# Patient Record
Sex: Female | Born: 1980 | Race: Black or African American | Hispanic: No | Marital: Married | Smoking: Former smoker
Health system: Southern US, Community
[De-identification: ages and names within clinical notes are randomized; demographics above are authoritative.]

## PROBLEM LIST (undated history)

## (undated) DIAGNOSIS — O24419 Gestational diabetes mellitus in pregnancy, unspecified control: Secondary | ICD-10-CM

## (undated) DIAGNOSIS — B009 Herpesviral infection, unspecified: Secondary | ICD-10-CM

## (undated) DIAGNOSIS — Z8619 Personal history of other infectious and parasitic diseases: Secondary | ICD-10-CM

## (undated) DIAGNOSIS — K219 Gastro-esophageal reflux disease without esophagitis: Secondary | ICD-10-CM

## (undated) HISTORY — DX: Gestational diabetes mellitus in pregnancy, unspecified control: O24.419

## (undated) HISTORY — DX: Gastro-esophageal reflux disease without esophagitis: K21.9

## (undated) HISTORY — DX: Herpesviral infection, unspecified: B00.9

## (undated) HISTORY — DX: Personal history of other infectious and parasitic diseases: Z86.19

## (undated) HISTORY — PX: NO PAST SURGERIES: SHX2092

---
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
## 2012-11-03 ENCOUNTER — Other Ambulatory Visit (HOSPITAL_COMMUNITY)
Admission: RE | Admit: 2012-11-03 | Discharge: 2012-11-03 | Disposition: A | Discharge status: Home / Self Care | Payer: BC Managed Care – PPO | Source: Ambulatory Visit | Attending: Family Medicine | Admitting: Family Medicine

## 2012-11-03 ENCOUNTER — Other Ambulatory Visit: Payer: Self-pay | Admitting: Family Medicine

## 2012-11-03 DIAGNOSIS — Z113 Encounter for screening for infections with a predominantly sexual mode of transmission: Secondary | ICD-10-CM | POA: Insufficient documentation

## 2012-11-03 DIAGNOSIS — Z01419 Encounter for gynecological examination (general) (routine) without abnormal findings: Secondary | ICD-10-CM | POA: Insufficient documentation

## 2014-06-30 LAB — OB RESULTS CONSOLE ABO/RH: RH Type: POSITIVE

## 2014-06-30 LAB — OB RESULTS CONSOLE GC/CHLAMYDIA
Chlamydia: NEGATIVE
GC PROBE AMP, GENITAL: NEGATIVE

## 2014-06-30 LAB — OB RESULTS CONSOLE HEPATITIS B SURFACE ANTIGEN: Hepatitis B Surface Ag: NEGATIVE

## 2014-06-30 LAB — OB RESULTS CONSOLE RPR: RPR: NONREACTIVE

## 2014-06-30 LAB — OB RESULTS CONSOLE HIV ANTIBODY (ROUTINE TESTING): HIV: NONREACTIVE

## 2014-06-30 LAB — OB RESULTS CONSOLE RUBELLA ANTIBODY, IGM: Rubella: IMMUNE

## 2014-06-30 LAB — OB RESULTS CONSOLE ANTIBODY SCREEN: Antibody Screen: NEGATIVE

## 2014-08-26 NOTE — L&D Delivery Note (Signed)
0600: Nurse call reporting patient c/o constant pressure and urge to push. Reports VE C/C with BBOW. In room to assess and BOW noted at introitus.  Patient requesting AROM, which was performed with moderate amount light MSF noted.  After AROM, infant hair noted at introitus and patient instructed on pushing methods.  FHR remained reassuring throughout pushing efforts and patient delivered as below.    Delivery Note At 6:13 AM, on Dec 26, 2014, a viable female, Anna Schwartz, was delivered via Vaginal, Spontaneous Delivery (Presentation: Left Occiput Anterior with restitution to Direct OP) Shoulders delivered easily and infant with good tone and spontaneous cry. Tactile stimulation given by provider and infant placed on mother's abdomen where nurse continued tactile stimulation. Infant APGARs: 8, 9.  Cord clamped, cut, and blood collected. Placenta delivered spontaneously and noted to be intact with 3VC upon inspection. Vaginal inspection revealed bilateral periurethral lacerations, with the left side extending into the labia, and a small vaginal laceration that was repaired for hemostatis.  No additional anesthetic necessary and patient tolerated the procedure well.  Fundus firm, at the umbilicus, and bleeding small.  Mother hemodynamically stable and infant nursing prior to provider exit.  Mother birth control method not assessed.  Infant weight at one hour of life: 7lbs 12.3oz (3525g).    Anesthesia: Epidural  Episiotomy: None Lacerations: 1st Degree, Periurethral Suture Repair: 3.0 vicryl Est. Blood Loss (mL): 201  Mom to postpartum.  Baby to Couplet care / Skin to Skin.  Detrick Dani LYNN MSN, CNM 12/26/2014, 7:17 AM

## 2014-09-13 ENCOUNTER — Inpatient Hospital Stay (HOSPITAL_COMMUNITY): Admission: AD | Admit: 2014-09-13 | Payer: Self-pay | Source: Ambulatory Visit | Admitting: Obstetrics & Gynecology

## 2014-11-16 ENCOUNTER — Encounter: Payer: BLUE CROSS/BLUE SHIELD | Attending: Obstetrics & Gynecology

## 2014-11-16 VITALS — Ht 63.0 in | Wt 205.1 lb

## 2014-11-16 DIAGNOSIS — Z713 Dietary counseling and surveillance: Secondary | ICD-10-CM | POA: Insufficient documentation

## 2014-11-16 DIAGNOSIS — O24419 Gestational diabetes mellitus in pregnancy, unspecified control: Secondary | ICD-10-CM | POA: Diagnosis not present

## 2014-11-16 NOTE — Progress Notes (Signed)
  Patient was seen on 11/09/14 for Gestational Diabetes self-management . The following learning objectives were met by the patient :   States the definition of Gestational Diabetes  States why dietary management is important in controlling blood glucose  Describes the effects of carbohydrates on blood glucose levels  Demonstrates ability to create a balanced meal plan  Demonstrates carbohydrate counting   States when to check blood glucose levels  Demonstrates proper blood glucose monitoring techniques  States the effect of stress and exercise on blood glucose levels  States the importance of limiting caffeine and abstaining from alcohol and smoking  Plan:  Aim for 2 Carb Choices per meal (30 grams) +/- 1 either way for breakfast Aim for 3 Carb Choices per meal (45 grams) +/- 1 either way from lunch and dinner Aim for 1-2 Carbs per snack Begin reading food labels for Total Carbohydrate and sugar grams of foods Consider  increasing your activity level by walking daily as tolerated Begin checking BG before breakfast and 2 hours after first bit of breakfast, lunch and dinner after  as directed by MD  Take medication  as directed by MD  Blood glucose monitor given: Provided by MD  Patient instructed to monitor glucose levels: FBS: 60 - <90 2 hour: <120  Patient received the following handouts:  Nutrition Diabetes and Pregnancy  Carbohydrate Counting List  Meal Planning worksheet  Patient will be seen for follow-up as needed.

## 2014-12-02 ENCOUNTER — Inpatient Hospital Stay (HOSPITAL_COMMUNITY)
Admission: AD | Admit: 2014-12-02 | Discharge: 2014-12-02 | Disposition: A | Discharge status: Home / Self Care | Payer: BLUE CROSS/BLUE SHIELD | Source: Ambulatory Visit | Attending: Obstetrics & Gynecology | Admitting: Obstetrics & Gynecology

## 2014-12-02 ENCOUNTER — Encounter (HOSPITAL_COMMUNITY): Payer: Self-pay | Admitting: *Deleted

## 2014-12-02 DIAGNOSIS — Z3A37 37 weeks gestation of pregnancy: Secondary | ICD-10-CM | POA: Diagnosis not present

## 2014-12-02 DIAGNOSIS — Z3689 Encounter for other specified antenatal screening: Secondary | ICD-10-CM

## 2014-12-02 HISTORY — DX: Gestational diabetes mellitus in pregnancy, unspecified control: O24.419

## 2014-12-02 LAB — CBC WITH DIFFERENTIAL/PLATELET
Basophils Absolute: 0 10*3/uL (ref 0.0–0.1)
Basophils Relative: 0 % (ref 0–1)
EOS PCT: 2 % (ref 0–5)
Eosinophils Absolute: 0.1 10*3/uL (ref 0.0–0.7)
HEMATOCRIT: 34.8 % — AB (ref 36.0–46.0)
Hemoglobin: 12.1 g/dL (ref 12.0–15.0)
LYMPHS ABS: 1.4 10*3/uL (ref 0.7–4.0)
LYMPHS PCT: 24 % (ref 12–46)
MCH: 27.9 pg (ref 26.0–34.0)
MCHC: 34.8 g/dL (ref 30.0–36.0)
MCV: 80.4 fL (ref 78.0–100.0)
MONO ABS: 0.5 10*3/uL (ref 0.1–1.0)
Monocytes Relative: 9 % (ref 3–12)
Neutro Abs: 3.8 10*3/uL (ref 1.7–7.7)
Neutrophils Relative %: 65 % (ref 43–77)
Platelets: 221 10*3/uL (ref 150–400)
RBC: 4.33 MIL/uL (ref 3.87–5.11)
RDW: 15.1 % (ref 11.5–15.5)
WBC: 5.9 10*3/uL (ref 4.0–10.5)

## 2014-12-02 NOTE — Discharge Instructions (Signed)
Fetal Movement Counts °Patient Name: __________________________________________________ Patient Due Date: ____________________ °Performing a fetal movement count is highly recommended in high-risk pregnancies, but it is good for every pregnant woman to do. Your health care provider may ask you to start counting fetal movements at 28 weeks of the pregnancy. Fetal movements often increase: °· After eating a full meal. °· After physical activity. °· After eating or drinking something sweet or cold. °· At rest. °Pay attention to when you feel the baby is most active. This will help you notice a pattern of your baby's sleep and wake cycles and what factors contribute to an increase in fetal movement. It is important to perform a fetal movement count at the same time each day when your baby is normally most active.  °HOW TO COUNT FETAL MOVEMENTS °1. Find a quiet and comfortable area to sit or lie down on your left side. Lying on your left side provides the best blood and oxygen circulation to your baby. °2. Write down the day and time on a sheet of paper or in a journal. °3. Start counting kicks, flutters, swishes, rolls, or jabs in a 2-hour period. You should feel at least 10 movements within 2 hours. °4. If you do not feel 10 movements in 2 hours, wait 2-3 hours and count again. Look for a change in the pattern or not enough counts in 2 hours. °SEEK MEDICAL CARE IF: °· You feel less than 10 counts in 2 hours, tried twice. °· There is no movement in over an hour. °· The pattern is changing or taking longer each day to reach 10 counts in 2 hours. °· You feel the baby is not moving as he or she usually does. °Date: ____________ Movements: ____________ Start time: ____________ Finish time: ____________  °Date: ____________ Movements: ____________ Start time: ____________ Finish time: ____________ °Date: ____________ Movements: ____________ Start time: ____________ Finish time: ____________ °Date: ____________ Movements:  ____________ Start time: ____________ Finish time: ____________ °Date: ____________ Movements: ____________ Start time: ____________ Finish time: ____________ °Date: ____________ Movements: ____________ Start time: ____________ Finish time: ____________ °Date: ____________ Movements: ____________ Start time: ____________ Finish time: ____________ °Date: ____________ Movements: ____________ Start time: ____________ Finish time: ____________  °Date: ____________ Movements: ____________ Start time: ____________ Finish time: ____________ °Date: ____________ Movements: ____________ Start time: ____________ Finish time: ____________ °Date: ____________ Movements: ____________ Start time: ____________ Finish time: ____________ °Date: ____________ Movements: ____________ Start time: ____________ Finish time: ____________ °Date: ____________ Movements: ____________ Start time: ____________ Finish time: ____________ °Date: ____________ Movements: ____________ Start time: ____________ Finish time: ____________ °Date: ____________ Movements: ____________ Start time: ____________ Finish time: ____________  °Date: ____________ Movements: ____________ Start time: ____________ Finish time: ____________ °Date: ____________ Movements: ____________ Start time: ____________ Finish time: ____________ °Date: ____________ Movements: ____________ Start time: ____________ Finish time: ____________ °Date: ____________ Movements: ____________ Start time: ____________ Finish time: ____________ °Date: ____________ Movements: ____________ Start time: ____________ Finish time: ____________ °Date: ____________ Movements: ____________ Start time: ____________ Finish time: ____________ °Date: ____________ Movements: ____________ Start time: ____________ Finish time: ____________  °Date: ____________ Movements: ____________ Start time: ____________ Finish time: ____________ °Date: ____________ Movements: ____________ Start time: ____________ Finish  time: ____________ °Date: ____________ Movements: ____________ Start time: ____________ Finish time: ____________ °Date: ____________ Movements: ____________ Start time: ____________ Finish time: ____________ °Date: ____________ Movements: ____________ Start time: ____________ Finish time: ____________ °Date: ____________ Movements: ____________ Start time: ____________ Finish time: ____________ °Date: ____________ Movements: ____________ Start time: ____________ Finish time: ____________  °Date: ____________ Movements: ____________ Start time: ____________ Finish   time: ____________ °Date: ____________ Movements: ____________ Start time: ____________ Finish time: ____________ °Date: ____________ Movements: ____________ Start time: ____________ Finish time: ____________ °Date: ____________ Movements: ____________ Start time: ____________ Finish time: ____________ °Date: ____________ Movements: ____________ Start time: ____________ Finish time: ____________ °Date: ____________ Movements: ____________ Start time: ____________ Finish time: ____________ °Date: ____________ Movements: ____________ Start time: ____________ Finish time: ____________  °Date: ____________ Movements: ____________ Start time: ____________ Finish time: ____________ °Date: ____________ Movements: ____________ Start time: ____________ Finish time: ____________ °Date: ____________ Movements: ____________ Start time: ____________ Finish time: ____________ °Date: ____________ Movements: ____________ Start time: ____________ Finish time: ____________ °Date: ____________ Movements: ____________ Start time: ____________ Finish time: ____________ °Date: ____________ Movements: ____________ Start time: ____________ Finish time: ____________ °Date: ____________ Movements: ____________ Start time: ____________ Finish time: ____________  °Date: ____________ Movements: ____________ Start time: ____________ Finish time: ____________ °Date: ____________  Movements: ____________ Start time: ____________ Finish time: ____________ °Date: ____________ Movements: ____________ Start time: ____________ Finish time: ____________ °Date: ____________ Movements: ____________ Start time: ____________ Finish time: ____________ °Date: ____________ Movements: ____________ Start time: ____________ Finish time: ____________ °Date: ____________ Movements: ____________ Start time: ____________ Finish time: ____________ °Date: ____________ Movements: ____________ Start time: ____________ Finish time: ____________  °Date: ____________ Movements: ____________ Start time: ____________ Finish time: ____________ °Date: ____________ Movements: ____________ Start time: ____________ Finish time: ____________ °Date: ____________ Movements: ____________ Start time: ____________ Finish time: ____________ °Date: ____________ Movements: ____________ Start time: ____________ Finish time: ____________ °Date: ____________ Movements: ____________ Start time: ____________ Finish time: ____________ °Date: ____________ Movements: ____________ Start time: ____________ Finish time: ____________ °Document Released: 09/11/2006 Document Revised: 12/27/2013 Document Reviewed: 06/08/2012 °Exit[employer]® Patient Information ©2015 [employer]. This information is not intended to replace advice given to you by your health care provider. Make sure you discuss any questions you have with your health care provider. °Braxton Hicks Contractions °Contractions of the uterus can occur throughout pregnancy. Contractions are not always a sign that you are in labor.  °WHAT ARE BRAXTON HICKS CONTRACTIONS?  °Contractions that occur before labor are called Braxton Hicks contractions, or false labor. Toward the end of pregnancy (32-34 weeks), these contractions can develop more often and may become more forceful. This is not true labor because these contractions do not result in opening (dilatation) and thinning of the cervix. They  are sometimes difficult to tell apart from true labor because these contractions can be forceful and people have different pain tolerances. You should not feel embarrassed if you go to the hospital with false labor. Sometimes, the only way to tell if you are in true labor is for your health care provider to look for changes in the cervix. °If there are no prenatal problems or other health problems associated with the pregnancy, it is completely safe to be sent home with false labor and await the onset of true labor. °HOW CAN YOU TELL THE DIFFERENCE BETWEEN TRUE AND FALSE LABOR? °False Labor °· The contractions of false labor are usually shorter and not as hard as those of true labor.   °· The contractions are usually irregular.   °· The contractions are often felt in the front of the lower abdomen and in the groin.   °· The contractions may go away when you walk around or change positions while lying down.   °· The contractions get weaker and are shorter lasting as time goes on.   °· The contractions do not usually become progressively stronger, regular, and closer together as with true labor.   °True Labor °5. Contractions in true labor last 30-70 seconds, become   very regular, usually become more intense, and increase in frequency.   °6. The contractions do not go away with walking.   °7. The discomfort is usually felt in the top of the uterus and spreads to the lower abdomen and low back.   °8. True labor can be determined by your health care provider with an exam. This will show that the cervix is dilating and getting thinner.   °WHAT TO REMEMBER °· Keep up with your usual exercises and follow other instructions given by your health care provider.   °· Take medicines as directed by your health care provider.   °· Keep your regular prenatal appointments.   °· Eat and drink lightly if you think you are going into labor.   °· If Braxton Hicks contractions are making you uncomfortable:   °· Change your position from  lying down or resting to walking, or from walking to resting.   °· Sit and rest in a tub of warm water.   °· Drink 2-3 glasses of water. Dehydration may cause these contractions.   °· Do slow and deep breathing several times an hour.   °WHEN SHOULD I SEEK IMMEDIATE MEDICAL CARE? °Seek immediate medical care if: °· Your contractions become stronger, more regular, and closer together.   °· You have fluid leaking or gushing from your vagina.   °· You have a fever.   °· You pass blood-tinged mucus.   °· You have vaginal bleeding.   °· You have continuous abdominal pain.   °· You have low back pain that you never had before.   °· You feel your baby's head pushing down and causing pelvic pressure.   °· Your baby is not moving as much as it used to.   °Document Released: 08/12/2005 Document Revised: 08/17/2013 Document Reviewed: 05/24/2013 °ExitCare® Patient Information ©2015 [employer]. This information is not intended to replace advice given to you by your health care provider. Make sure you discuss any questions you have with your health care provider. ° °
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
## 2014-12-14 ENCOUNTER — Telehealth (HOSPITAL_COMMUNITY): Payer: Self-pay | Admitting: *Deleted

## 2014-12-14 ENCOUNTER — Encounter (HOSPITAL_COMMUNITY): Payer: Self-pay | Admitting: *Deleted

## 2014-12-14 LAB — OB RESULTS CONSOLE GBS: GBS: POSITIVE

## 2014-12-14 NOTE — Telephone Encounter (Signed)
Preadmission screen  

## 2014-12-25 ENCOUNTER — Encounter (HOSPITAL_COMMUNITY): Payer: Self-pay | Admitting: *Deleted

## 2014-12-25 ENCOUNTER — Inpatient Hospital Stay (HOSPITAL_COMMUNITY)
Admission: AD | Admit: 2014-12-25 | Discharge: 2014-12-28 | DRG: 774 | Disposition: A | Discharge status: Home / Self Care | Payer: BLUE CROSS/BLUE SHIELD | Source: Ambulatory Visit | Attending: Obstetrics & Gynecology | Admitting: Obstetrics & Gynecology

## 2014-12-25 ENCOUNTER — Telehealth: Payer: Self-pay | Admitting: Obstetrics and Gynecology

## 2014-12-25 ENCOUNTER — Telehealth: Payer: Self-pay

## 2014-12-25 ENCOUNTER — Inpatient Hospital Stay (HOSPITAL_COMMUNITY): Payer: BLUE CROSS/BLUE SHIELD | Admitting: Anesthesiology

## 2014-12-25 DIAGNOSIS — Z87891 Personal history of nicotine dependence: Secondary | ICD-10-CM

## 2014-12-25 DIAGNOSIS — O99824 Streptococcus B carrier state complicating childbirth: Secondary | ICD-10-CM | POA: Diagnosis present

## 2014-12-25 DIAGNOSIS — O9962 Diseases of the digestive system complicating childbirth: Secondary | ICD-10-CM | POA: Diagnosis present

## 2014-12-25 DIAGNOSIS — B951 Streptococcus, group B, as the cause of diseases classified elsewhere: Secondary | ICD-10-CM

## 2014-12-25 DIAGNOSIS — O9832 Other infections with a predominantly sexual mode of transmission complicating childbirth: Secondary | ICD-10-CM | POA: Diagnosis present

## 2014-12-25 DIAGNOSIS — A6000 Herpesviral infection of urogenital system, unspecified: Secondary | ICD-10-CM | POA: Diagnosis present

## 2014-12-25 DIAGNOSIS — Z8249 Family history of ischemic heart disease and other diseases of the circulatory system: Secondary | ICD-10-CM | POA: Diagnosis not present

## 2014-12-25 DIAGNOSIS — Z833 Family history of diabetes mellitus: Secondary | ICD-10-CM | POA: Diagnosis not present

## 2014-12-25 DIAGNOSIS — K219 Gastro-esophageal reflux disease without esophagitis: Secondary | ICD-10-CM | POA: Diagnosis present

## 2014-12-25 DIAGNOSIS — O24420 Gestational diabetes mellitus in childbirth, diet controlled: Secondary | ICD-10-CM | POA: Diagnosis present

## 2014-12-25 DIAGNOSIS — Z823 Family history of stroke: Secondary | ICD-10-CM | POA: Diagnosis not present

## 2014-12-25 DIAGNOSIS — Z3A40 40 weeks gestation of pregnancy: Secondary | ICD-10-CM | POA: Diagnosis present

## 2014-12-25 DIAGNOSIS — O24419 Gestational diabetes mellitus in pregnancy, unspecified control: Secondary | ICD-10-CM | POA: Diagnosis present

## 2014-12-25 DIAGNOSIS — O9989 Other specified diseases and conditions complicating pregnancy, childbirth and the puerperium: Secondary | ICD-10-CM | POA: Diagnosis present

## 2014-12-25 DIAGNOSIS — B009 Herpesviral infection, unspecified: Secondary | ICD-10-CM | POA: Diagnosis not present

## 2014-12-25 HISTORY — DX: Personal history of other infectious and parasitic diseases: Z86.19

## 2014-12-25 LAB — COMPREHENSIVE METABOLIC PANEL
ALBUMIN: 2.9 g/dL — AB (ref 3.5–5.0)
ALT: 27 U/L (ref 14–54)
ANION GAP: 8 (ref 5–15)
AST: 36 U/L (ref 15–41)
Alkaline Phosphatase: 103 U/L (ref 38–126)
BUN: 6 mg/dL (ref 6–20)
CALCIUM: 8.6 mg/dL — AB (ref 8.9–10.3)
CO2: 22 mmol/L (ref 22–32)
Chloride: 105 mmol/L (ref 101–111)
Creatinine, Ser: 0.72 mg/dL (ref 0.44–1.00)
GFR calc Af Amer: >60 mL/min (ref >60)
Glucose, Bld: 67 mg/dL — ABNORMAL LOW (ref 70–99)
Potassium: 4.1 mmol/L (ref 3.5–5.1)
SODIUM: 135 mmol/L (ref 135–145)
Total Bilirubin: 0.3 mg/dL (ref 0.3–1.2)
Total Protein: 6 g/dL — ABNORMAL LOW (ref 6.5–8.1)

## 2014-12-25 LAB — GLUCOSE, CAPILLARY
GLUCOSE-CAPILLARY: 90 mg/dL (ref 70–99)
Glucose-Capillary: 79 mg/dL (ref 70–99)
Glucose-Capillary: 70 mg/dL (ref 70–99)

## 2014-12-25 LAB — CBC
HCT: 35.3 % — ABNORMAL LOW (ref 36.0–46.0)
Hemoglobin: 12.4 g/dL (ref 12.0–15.0)
MCH: 28.5 pg (ref 26.0–34.0)
MCHC: 35.1 g/dL (ref 30.0–36.0)
MCV: 81.1 fL (ref 78.0–100.0)
Platelets: 213 10*3/uL (ref 150–400)
RBC: 4.35 MIL/uL (ref 3.87–5.11)
RDW: 15.6 % — AB (ref 11.5–15.5)
WBC: 5.7 10*3/uL (ref 4.0–10.5)

## 2014-12-25 LAB — LACTATE DEHYDROGENASE: LDH: 252 U/L — ABNORMAL HIGH (ref 98–192)

## 2014-12-25 LAB — URIC ACID: Uric Acid, Serum: 3.8 mg/dL (ref 2.3–6.6)

## 2014-12-25 MED ORDER — LIDOCAINE HCL (PF) 1 % IJ SOLN
30.0000 mL | INTRAMUSCULAR | Status: DC | PRN
  Filled 2014-12-25: qty 30

## 2014-12-25 MED ORDER — OXYTOCIN 40 UNITS IN LACTATED RINGERS INFUSION - SIMPLE MED: 62.5000 mL/h | INTRAVENOUS | Status: DC

## 2014-12-25 MED ORDER — OXYTOCIN 40 UNITS IN LACTATED RINGERS INFUSION - SIMPLE MED
1.0000 m[IU]/min | INTRAVENOUS | Status: DC
  Administered 2014-12-25: 4 m[IU]/min via INTRAVENOUS

## 2014-12-25 MED ORDER — LACTATED RINGERS IV SOLN
500.0000 mL | INTRAVENOUS | Status: DC | PRN
  Administered 2014-12-25: 1000 mL via INTRAVENOUS

## 2014-12-25 MED ORDER — PENICILLIN G POTASSIUM 5000000 UNITS IJ SOLR
5.0000 10*6.[IU] | Freq: Once | INTRAVENOUS | Status: AC
  Administered 2014-12-25: 5 10*6.[IU] via INTRAVENOUS
  Filled 2014-12-25: qty 5

## 2014-12-25 MED ORDER — DIPHENHYDRAMINE HCL 50 MG/ML IJ SOLN: 12.5000 mg | INTRAMUSCULAR | Status: DC | PRN

## 2014-12-25 MED ORDER — EPHEDRINE 5 MG/ML INJ
10.0000 mg | INTRAVENOUS | Status: DC | PRN
  Filled 2014-12-25: qty 2

## 2014-12-25 MED ORDER — BUTORPHANOL TARTRATE 1 MG/ML IJ SOLN
1.0000 mg | INTRAMUSCULAR | Status: DC | PRN
  Administered 2014-12-25 (×4): 1 mg via INTRAVENOUS
  Filled 2014-12-25 (×4): qty 1

## 2014-12-25 MED ORDER — OXYTOCIN 40 UNITS IN LACTATED RINGERS INFUSION - SIMPLE MED
1.0000 m[IU]/min | INTRAVENOUS | Status: DC
  Administered 2014-12-25: 1 m[IU]/min via INTRAVENOUS
  Filled 2014-12-25: qty 1000

## 2014-12-25 MED ORDER — OXYCODONE-ACETAMINOPHEN 5-325 MG PO TABS: 2.0000 | ORAL_TABLET | ORAL | Status: DC | PRN

## 2014-12-25 MED ORDER — FLEET ENEMA 7-19 GM/118ML RE ENEM: 1.0000 | ENEMA | RECTAL | Status: DC | PRN

## 2014-12-25 MED ORDER — OXYCODONE-ACETAMINOPHEN 5-325 MG PO TABS: 1.0000 | ORAL_TABLET | ORAL | Status: DC | PRN

## 2014-12-25 MED ORDER — FENTANYL 2.5 MCG/ML BUPIVACAINE 1/10 % EPIDURAL INFUSION (WH - ANES)
14.0000 mL/h | INTRAMUSCULAR | Status: DC | PRN
Start: 2014-12-25 — End: 2014-12-26
  Administered 2014-12-25 (×2): 14 mL/h via EPIDURAL

## 2014-12-25 MED ORDER — OXYTOCIN BOLUS FROM INFUSION
500.0000 mL | INTRAVENOUS | Status: DC
  Administered 2014-12-26: 500 mL via INTRAVENOUS

## 2014-12-25 MED ORDER — ZOLPIDEM TARTRATE 5 MG PO TABS: 5.0000 mg | ORAL_TABLET | Freq: Every evening | ORAL | Status: DC | PRN

## 2014-12-25 MED ORDER — CITRIC ACID-SODIUM CITRATE 334-500 MG/5ML PO SOLN
30.0000 mL | ORAL | Status: DC | PRN
Start: 2014-12-25 — End: 2014-12-26

## 2014-12-25 MED ORDER — PENICILLIN G POTASSIUM 5000000 UNITS IJ SOLR
2.5000 10*6.[IU] | INTRAVENOUS | Status: DC
  Administered 2014-12-25 – 2014-12-26 (×3): 2.5 10*6.[IU] via INTRAVENOUS
  Filled 2014-12-25 (×7): qty 2.5

## 2014-12-25 MED ORDER — PHENYLEPHRINE 40 MCG/ML (10ML) SYRINGE FOR IV PUSH (FOR BLOOD PRESSURE SUPPORT)
PREFILLED_SYRINGE | INTRAVENOUS | Status: AC
  Filled 2014-12-25: qty 20

## 2014-12-25 MED ORDER — TERBUTALINE SULFATE 1 MG/ML IJ SOLN: 0.2500 mg | Freq: Once | INTRAMUSCULAR | Status: AC | PRN

## 2014-12-25 MED ORDER — FENTANYL 2.5 MCG/ML BUPIVACAINE 1/10 % EPIDURAL INFUSION (WH - ANES)
INTRAMUSCULAR | Status: AC
  Administered 2014-12-25: 14 mL/h via EPIDURAL
  Filled 2014-12-25: qty 125

## 2014-12-25 MED ORDER — PHENYLEPHRINE 40 MCG/ML (10ML) SYRINGE FOR IV PUSH (FOR BLOOD PRESSURE SUPPORT)
80.0000 ug | PREFILLED_SYRINGE | INTRAVENOUS | Status: DC | PRN
  Filled 2014-12-25: qty 2

## 2014-12-25 MED ORDER — ACETAMINOPHEN 325 MG PO TABS: 650.0000 mg | ORAL_TABLET | ORAL | Status: DC | PRN

## 2014-12-25 MED ORDER — ONDANSETRON HCL 4 MG/2ML IJ SOLN: 4.0000 mg | Freq: Four times a day (QID) | INTRAMUSCULAR | Status: DC | PRN

## 2014-12-25 MED ORDER — LACTATED RINGERS IV SOLN
INTRAVENOUS | Status: DC
  Administered 2014-12-25 (×2): via INTRAVENOUS

## 2014-12-25 NOTE — Progress Notes (Signed)
  Subjective: Feels contractions have spaced out.  No bleeding or leaking.  Objective: BP 140/87 mmHg  Pulse 91  Temp(Src) 98.3 F (36.8 C) (Oral)  Resp 18  Ht 5\' 2"  (1.575 m)  Wt 93.441 kg (206 lb)  BMI 37.67 kg/m2  SpO2 100%      Filed Vitals:   12/25/14 1413 12/25/14 1504 12/25/14 1603 12/25/14 1704  BP: 124/73 143/84 121/82 140/87  Pulse: 100 94 97 91  Temp: 98.3 F (36.8 C)     TempSrc: Oral     Resp: 16 20 20 18   Height: 5\' 2"  (1.575 m)     Weight: 93.441 kg (206 lb)     SpO2:        FHT: Category 1 UC:   irregular, every 10-12 minutes SVE:   Dilation: 1 Effacement (%): 100 Station: -2 Exam by:: Manfred ArchV Ramez Arrona CNM  Cervix posterior.  Received 2nd dose PCN at 1732  Results for orders placed or performed during the hospital encounter of 12/25/14 (from the past 24 hour(s))  CBC     Status: Abnormal   Collection Time: 12/25/14 12:10 PM  Result Value Ref Range   WBC 5.7 4.0 - 10.5 K/uL   RBC 4.35 3.87 - 5.11 MIL/uL   Hemoglobin 12.4 12.0 - 15.0 g/dL   HCT 28.335.3 (L) 15.136.0 - 76.146.0 %   MCV 81.1 78.0 - 100.0 fL   MCH 28.5 26.0 - 34.0 pg   MCHC 35.1 30.0 - 36.0 g/dL   RDW 60.715.6 (H) 37.111.5 - 06.215.5 %   Platelets 213 150 - 400 K/uL  Comprehensive metabolic panel     Status: Abnormal   Collection Time: 12/25/14 12:10 PM  Result Value Ref Range   Sodium 135 135 - 145 mmol/L   Potassium 4.1 3.5 - 5.1 mmol/L   Chloride 105 101 - 111 mmol/L   CO2 22 22 - 32 mmol/L   Glucose, Bld 67 (L) 70 - 99 mg/dL   BUN 6 6 - 20 mg/dL   Creatinine, Ser 6.940.72 0.44 - 1.00 mg/dL   Calcium 8.6 (L) 8.9 - 10.3 mg/dL   Total Protein 6.0 (L) 6.5 - 8.1 g/dL   Albumin 2.9 (L) 3.5 - 5.0 g/dL   AST 36 15 - 41 U/L   ALT 27 14 - 54 U/L   Alkaline Phosphatase 103 38 - 126 U/L   Total Bilirubin 0.3 0.3 - 1.2 mg/dL   GFR calc non Af Amer >60 >60 mL/min   GFR calc Af Amer >60 >60 mL/min   Anion gap 8 5 - 15  Lactate dehydrogenase     Status: Abnormal   Collection Time: 12/25/14 12:10 PM  Result Value  Ref Range   LDH 252 (H) 98 - 192 U/L  Uric acid     Status: None   Collection Time: 12/25/14 12:10 PM  Result Value Ref Range   Uric Acid, Serum 3.8 2.3 - 6.6 mg/dL  Glucose, capillary     Status: None   Collection Time: 12/25/14  3:07 PM  Result Value Ref Range   Glucose-Capillary 79 70 - 99 mg/dL    Assessment:  Latent labor Previous Category 2 FHR, currently Category 1 GBS positive Hx HSV 2 GDM  Plan: Recommended pitocin augmentation.  Patient agreeable with plan.  Nigel BridgemanLATHAM, Chrisandra Wiemers CNM 12/25/2014, 5:39 PM

## 2014-12-25 NOTE — H&P (Signed)
Anna Schwartz is a 34 y.o. female, G2P0010 at 140 3/7 weeks, presenting for UCs since early am, now q 5-7 min.  Denies leaking or bleeding, reports +FM.  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  Reports CBGs have been WNL, diet-controlled.  Patient Active Problem List   Diagnosis Date Noted  . Normal labor 12/25/2014  . Routine cultures positive for HSV2 12/25/2014  . Gestational diabetes mellitus 12/25/2014  . Positive GBS test 12/25/2014    OB Hx: [facility] Dr. Charlotta Newtonzan. A1 GDM--diet controlled, dx at 34 weeks due to glycosuria Normal anatomy US at 20 weeks HSV--no recent/current outbreak, on Valtrex suppression since 36 weeks. GBS positive at 36 weeks.  OB History    Gravida Para Term Preterm AB TAB SAB Ectopic Multiple Living   2    1  1        08/2013--SAB  Past Medical History  Diagnosis Date  . Gestational diabetes mellitus, antepartum   . GERD (gastroesophageal reflux disease)   . Routine cultures positive for HSV2   . Hx of varicella   . Gestational diabetes     diet controlled   History reviewed. No pertinent past surgical history. Family History: family history includes Asthma in her brother; Cancer in her maternal aunt, maternal grandfather, paternal grandfather, and paternal grandmother; Diabetes in her maternal grandmother and mother; Hypertension in her brother and father; Stroke in her maternal grandmother.   Social History:  reports that she has quit smoking. She has never used smokeless tobacco. She reports that she does not drink alcohol or use illicit drugs.  Patient is married to the FOB, Loraine LericheMark, who is involved and supportive.  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   Prenatal Transfer Tool  Maternal Diabetes: Yes:  Diabetes Type:  Diet controlled Genetic Screening: Normal Maternal Ultrasounds/Referrals: Normal Fetal Ultrasounds or other Referrals:  None Maternal Substance Abuse:  No Significant Maternal Medications:   None Significant Maternal Lab Results: Lab values include: Group B Strep positive  TDAP 09/19/14 Received flu vaccine at work  ROS:  Contractions, +FM  No Known Allergies   Dilation: 1 Effacement (%): 100 Station:  (high) Exam by:: Chip BoerVicki Blood pressure 135/82, pulse 82, temperature 98.3 F (36.8 C), temperature source Oral, resp. rate 18, height 5\' 3"  (1.6 m), weight 93.441 kg (206 lb).  Chest clear Heart RRR without murmur Abd gravid, NT, FH 39 cm Pelvic: Initial VE 1 cm, 60%, vtx, high--After ambulating, now 1 cm, 100%, vtx, -2.  No HSV lesions on vulvar or in vagina, cervix. Ext: WNL  FHR: Category 2--Baseline FHR 165-175, decels with several UCs, moderate variability UCs:  q 7-8 min  Prenatal labs: ABO, Rh: O/Positive/-- (11/05 0000) Antibody: Negative (11/05 0000) Rubella:   Immune RPR: Nonreactive (11/05 0000)  HBsAg: Negative (11/05 0000)  HIV: Non-reactive (11/05 0000)  GBS: Positive (04/20 0000) Sickle cell/Hgb electrophoresis:  Unavailable Pap:  2014 GC:  Negative 06/30/14 Chlamydia:  Negative 06/30/14 Genetic screenings:  Normal quad screen Glucola:  1 hour GTT 134, but glycosuria at 34 weeks, abnormal 3 hour GTT, dx with A1 GDM Other:   Hgb 12.7 at NOB, WNL at 28 weeks    Assessment/Plan: IUP at 40 3/7 weeks Early labor Equivocal FHR tracing GBS positive ? Narrow pelvis A1GDM Hx HSV 2--no recent/current lesions  Plan: Admit to Birthing Suite per consult with Dr. Sallye OberKulwa Routine [facility] orders Pain med/epidural prn PCN G for GBS prophylaxis  IV hydration Close observation  of FHR status--reviewed issue of possible need for C/S for [facility] or FTP.  Nyra Capes, [place] 12/25/2014, 12:39 PM

## 2014-12-25 NOTE — MAU Note (Signed)
Per HMitchell, RN charge, pt to go to room 164 

## 2014-12-25 NOTE — MAU Note (Signed)
Contractions started just after midnight, was able to sleep a little. Now every 5-7 min.  No bleeding or leaking.  On valtrex for suppression.  +GBS. Diet controlled

## 2014-12-25 NOTE — Progress Notes (Addendum)
  Subjective: Remains in [facility] due [facility].  Requesting pain med.  Objective: BP 106/63 mmHg  Pulse 88  Temp(Src) 98.3 F (36.8 C) (Oral)  Resp 18  Ht 5\' 3"  (1.6 m)  Wt 93.441 kg (206 lb)  BMI 36.50 kg/m2  SpO2 100%      FHT: Category 1 at present--accels and moderate variability last 5 min of tracing. UC:   regular, every 7 minutes SVE:  1 cm, 100%, vtx, -2 by Dr. Sallye OberKulwa  Assessment:  Early labor Improved FHR tracing  Plan: Stadol IV  LATHAM, VICKI CNM 12/25/2014, 1:17 PM  I saw and examined patient at bedside and agree with above findings assessment and plan. Patient reports that an ultrasound done about two and a half weeks ago showed EFW of 7 pounds. On my exam slightly narrow pubic arch noted,  but pelvis contracted. I believe her pelvis is adequate for a trial of labor. I discussed all these findings with patient. If no spontaneous progression of labor will do labor augmentation.  Continue with close monitoring of fetal and maternal status.  Dr. Sallye OberKulwa.

## 2014-12-25 NOTE — Telephone Encounter (Signed)
Patient call reporting contractions since 0153. Denies LoF and VB, while reporting good fetal movement.  Patient states contractions every 3-7 minutes and that she is coping well.  Patient expresses concern about driving [place], from [place], and not being admitted.  Patient given option to come in for evaluation or continue to monitor at home until contractions more regular.  Patient advised that SROM or VB is indication for immediate evaluation at MAU.  Patient verbalized understanding and opts to monitor at home.  Patient encouraged to call back and update provider as things change.  Patient further encouraged to rest and stay hydrated.  No other questions or concerns. JE, CNM

## 2014-12-25 NOTE — MAU Note (Signed)
Returned from walking, pains seem stronger

## 2014-12-25 NOTE — Progress Notes (Signed)
Anna Schwartz MRN: [id number]  Subjective: -Care Assumed of 34 y.o. G2P0 at 32.3wks who presents in early labor and admitted for NRFHT.  At bedside, with Anna Schwartz, CNM, to discuss POC for tonight.  Patient expresses frustrations with labor and augmentation process as well as desire to be delivered during the night.  Reports contractions are manageable and breathing well through them.  Husband, Anna Schwartz, at bedside.   Objective: BP 141/94 mmHg  Pulse 90  Temp(Src) 98.3 F (36.8 C) (Oral)  Resp 16  Ht 5\' 2"  (1.575 m)  Wt 93.441 kg (206 lb)  BMI 37.67 kg/m2  SpO2 100%     FHT: 145 bpm, Mod Var, +Early Decels, +Accels UC: Q676min, palpates strong   SVE:   Deferred Membranes:Intact Pitocin:1064mUn/min  Assessment:  IUP at 40.3wks Cat I FT  Pitocin Augmentation  Plan: -Discussed POC to include continued pitocin titration -Reassurances given and patient informed that delivery, during the night, may not occur -No other questions or concerns -Will reassess cervix at later time -Continue other mgmt as ordered   [facility] Center[facility], Anna Brune LYNN,MSN, CNM 12/25/2014, 7:48 PM

## 2014-12-26 ENCOUNTER — Encounter (HOSPITAL_COMMUNITY): Payer: Self-pay | Admitting: *Deleted

## 2014-12-26 LAB — RPR: RPR Ser Ql: NONREACTIVE

## 2014-12-26 LAB — HIV ANTIBODY (ROUTINE TESTING W REFLEX): HIV SCREEN 4TH GENERATION: NONREACTIVE

## 2014-12-26 LAB — GLUCOSE, CAPILLARY: Glucose-Capillary: 76 mg/dL (ref 70–99)

## 2014-12-26 LAB — ABO/RH: ABO/RH(D): O POS

## 2014-12-26 MED ORDER — IBUPROFEN 600 MG PO TABS
600.0000 mg | ORAL_TABLET | Freq: Four times a day (QID) | ORAL | Status: DC
  Administered 2014-12-26 – 2014-12-28 (×7): 600 mg via ORAL
  Filled 2014-12-26 (×8): qty 1

## 2014-12-26 MED ORDER — OXYCODONE-ACETAMINOPHEN 5-325 MG PO TABS: 2.0000 | ORAL_TABLET | ORAL | Status: DC | PRN

## 2014-12-26 MED ORDER — PANTOPRAZOLE SODIUM 40 MG PO TBEC: 40.0000 mg | DELAYED_RELEASE_TABLET | Freq: Two times a day (BID) | ORAL | Status: DC | PRN

## 2014-12-26 MED ORDER — BENZOCAINE-MENTHOL 20-0.5 % EX AERO
1.0000 "application " | INHALATION_SPRAY | CUTANEOUS | Status: DC | PRN
  Administered 2014-12-26: 1 via TOPICAL
  Filled 2014-12-26: qty 56

## 2014-12-26 MED ORDER — OXYCODONE-ACETAMINOPHEN 5-325 MG PO TABS: 1.0000 | ORAL_TABLET | ORAL | Status: DC | PRN

## 2014-12-26 MED ORDER — PRENATAL MULTIVITAMIN CH
1.0000 | ORAL_TABLET | Freq: Every day | ORAL | Status: DC
  Administered 2014-12-27: 1 via ORAL
  Filled 2014-12-26: qty 1

## 2014-12-26 MED ORDER — LANOLIN HYDROUS EX OINT: TOPICAL_OINTMENT | CUTANEOUS | Status: DC | PRN

## 2014-12-26 MED ORDER — DIBUCAINE 1 % RE OINT: 1.0000 "application " | TOPICAL_OINTMENT | RECTAL | Status: DC | PRN

## 2014-12-26 MED ORDER — BUPIVACAINE HCL (PF) 0.25 % IJ SOLN
INTRAMUSCULAR | Status: DC | PRN
  Administered 2014-12-25 (×2): 4 mL via EPIDURAL

## 2014-12-26 MED ORDER — ONDANSETRON HCL 4 MG/2ML IJ SOLN: 4.0000 mg | INTRAMUSCULAR | Status: DC | PRN

## 2014-12-26 MED ORDER — DIPHENHYDRAMINE HCL 25 MG PO CAPS: 25.0000 mg | ORAL_CAPSULE | Freq: Four times a day (QID) | ORAL | Status: DC | PRN

## 2014-12-26 MED ORDER — SIMETHICONE 80 MG PO CHEW: 80.0000 mg | CHEWABLE_TABLET | ORAL | Status: DC | PRN

## 2014-12-26 MED ORDER — ONDANSETRON HCL 4 MG PO TABS: 4.0000 mg | ORAL_TABLET | ORAL | Status: DC | PRN

## 2014-12-26 MED ORDER — ACETAMINOPHEN 325 MG PO TABS
650.0000 mg | ORAL_TABLET | ORAL | Status: DC | PRN
Start: 2014-12-26 — End: 2014-12-28

## 2014-12-26 MED ORDER — LIDOCAINE-EPINEPHRINE (PF) 2 %-1:200000 IJ SOLN
INTRAMUSCULAR | Status: DC | PRN
  Administered 2014-12-25: 3 mL

## 2014-12-26 MED ORDER — SENNOSIDES-DOCUSATE SODIUM 8.6-50 MG PO TABS
2.0000 | ORAL_TABLET | ORAL | Status: DC
  Administered 2014-12-27 (×2): 2 via ORAL
  Filled 2014-12-26 (×2): qty 2

## 2014-12-26 MED ORDER — WITCH HAZEL-GLYCERIN EX PADS: 1.0000 "application " | MEDICATED_PAD | CUTANEOUS | Status: DC | PRN

## 2014-12-26 MED ORDER — ZOLPIDEM TARTRATE 5 MG PO TABS: 5.0000 mg | ORAL_TABLET | Freq: Every evening | ORAL | Status: DC | PRN

## 2014-12-26 NOTE — Progress Notes (Addendum)
Anna Schwartz MRN: [id number]  Subjective: -Patient resting in bed. Comfortable with epidural. Reports tightening of abdomen, but denies urge to push.    Objective: BP 123/58 mmHg  Pulse 92  Temp(Src) 98.6 F (37 C) (Oral)  Resp 16  Ht  (1.575 m)  Wt 93.441 kg (206 lb)  BMI 37.67 kg/m2  SpO2 100%     FHT: 145 bpm, Mod Var, +Early Decels, +Accels UC: Q1-67min, palpates moderate   SVE:   Dilation: 6 Effacement (%): 100 Station: -2 Exam by:: L.Mears,RN at 0225 Membranes: Intact Pitocin: 79mUn/min  Results for orders placed or performed during the hospital encounter of 12/25/14 (from the past 24 hour(s))  CBC     Status: Abnormal   Collection Time: 12/25/14 12:10 PM  Result Value Ref Range   WBC 5.7 4.0 - 10.5 K/uL   RBC 4.35 3.87 - 5.11 MIL/uL   Hemoglobin 12.4 12.0 - 15.0 g/dL   HCT 78.4 (L) 69.6 - 29.5 %   MCV 81.1 78.0 - 100.0 fL   MCH 28.5 26.0 - 34.0 pg   MCHC 35.1 30.0 - 36.0 g/dL   RDW 28.4 (H) 13.2 - 44.0 %   Platelets 213 150 - 400 K/uL  Comprehensive metabolic panel     Status: Abnormal   Collection Time: 12/25/14 12:10 PM  Result Value Ref Range   Sodium 135 135 - 145 mmol/L   Potassium 4.1 3.5 - 5.1 mmol/L   Chloride 105 101 - 111 mmol/L   CO2 22 22 - 32 mmol/L   Glucose, Bld 67 (L) 70 - 99 mg/dL   BUN 6 6 - 20 mg/dL   Creatinine, Ser 1.02 0.44 - 1.00 mg/dL   Calcium 8.6 (L) 8.9 - 10.3 mg/dL   Total Protein 6.0 (L) 6.5 - 8.1 g/dL   Albumin 2.9 (L) 3.5 - 5.0 g/dL   AST 36 15 - 41 U/L   ALT 27 14 - 54 U/L   Alkaline Phosphatase 103 38 - 126 U/L   Total Bilirubin 0.3 0.3 - 1.2 mg/dL   GFR calc non Af Amer >60 >60 mL/min   GFR calc Af Amer >60 >60 mL/min   Anion gap 8 5 - 15  Lactate dehydrogenase     Status: Abnormal   Collection Time: 12/25/14 12:10 PM  Result Value Ref Range   LDH 252 (H) 98 - 192 U/L  Uric acid     Status: None   Collection Time: 12/25/14 12:10 PM  Result Value Ref Range   Uric Acid, Serum 3.8 2.3 - 6.6 mg/dL  Glucose,  capillary     Status: None   Collection Time: 12/25/14  3:07 PM  Result Value Ref Range   Glucose-Capillary 79 70 - 99 mg/dL  Glucose, capillary     Status: None   Collection Time: 12/25/14  5:57 PM  Result Value Ref Range   Glucose-Capillary 90 70 - 99 mg/dL  Glucose, capillary     Status: None   Collection Time: 12/25/14 10:05 PM  Result Value Ref Range   Glucose-Capillary 70 70 - 99 mg/dL  Glucose, capillary     Status: None   Collection Time: 12/26/14  2:24 AM  Result Value Ref Range   Glucose-Capillary 76 70 - 99 mg/dL    Assessment:  IUP at 40.4wks Cat I FT  Pitocin Augmentation GDM-A1  Plan: -Continue present mgmt   Anna Rowe LYNN,MSN, CNM 12/26/2014, 3:26 AM  Addendum:  S: Nurse call reporting patient Lip/Rim  with BBOW.  In room to assess.  Patient requests no AROM and to allow SROM.  Reports that she feels "she is moving down." Coping well with rectal pressure.   O: FHR: 135 bpm, Mod Var, + Early Decels, +Accels  UC: Q2-663min, palpates moderate Filed Vitals:   12/26/14 0402  BP: 119/67  Pulse: 105  Temp:   Resp:    A: IUP at 40.4wks Cat I FT Pitocin Augmentation  P: Await SROM  Continue other mgmt as ordered  Anna Eriksson LYNN MSN, CNM [phone]

## 2014-12-26 NOTE — Lactation Note (Signed)
This note was copied from the chart of Anna Zakkiyya Hessling. Lactation Consultation Note    Initial consult with this mom of a term baby, now 8010 hours old. I assisted mom with latching in football hold. The baby eagerly latches, strong suckles and visible swallows. Mom reports tender nipples from baby's strong suckles and pulls. Mom has semi flat nipples, but baby latches deeply and easily. Basic breast feeding teaching done with mom and dad, from baby and me book. Lactation services also reviewed. Mom knows to call for questions/concerns.   Patient Name: Anna Schwartz Today's Date: 12/26/2014   Maternal Data Formula Feeding for Exclusion: No Has patient been taught Hand Expression?: Yes  Feeding Feeding Type: Breast Fed Length of feed: 15 min  LATCH Score/Interventions Latch: Grasps breast easily, tongue down, lips flanged, rhythmical sucking.  Audible Swallowing: A few with stimulation Intervention(s): Skin to skin  Type of Nipple: Flat Intervention(s): No intervention needed  Comfort (Breast/Nipple): Soft / non-tender     Hold (Positioning): Assistance needed to correctly position infant at breast and maintain latch. Intervention(s): Breastfeeding basics reviewed;Support Pillows;Position options;Skin to skin  LATCH Score: 7  Lactation Tools Discussed/Used     Consult Status Consult Status: Follow-up Date: 12/27/14 Follow-up type: In-patient    Alfred LevinsLee, Esly Selvage Anne 12/26/2014, 4:58 PM

## 2014-12-26 NOTE — Anesthesia Postprocedure Evaluation (Signed)
Anesthesia Post Note  Patient: Anna Schwartz  Procedure(s) Performed: * No procedures listed *  Anesthesia type: Epidural  Patient location: Mother/Baby  Post pain: Pain level controlled  Post assessment: Post-op Vital signs reviewed  Last Vitals:  Filed Vitals:   12/26/14 0831  BP: 126/83  Pulse: 90  Temp:   Resp:     Post vital signs: Reviewed  Level of consciousness:alert  Complications: No apparent anesthesia complications

## 2014-12-26 NOTE — Anesthesia Procedure Notes (Signed)
Epidural Patient location during procedure: OB  Staffing Anesthesiologist: Nadav Swindell, CHRIS Performed by: anesthesiologist   Preanesthetic Checklist Completed: patient identified, surgical consent, pre-op evaluation, timeout performed, IV checked, risks and benefits discussed and monitors and equipment checked  Epidural Patient position: sitting Prep: site prepped and draped and DuraPrep Patient monitoring: heart rate, cardiac monitor, continuous pulse ox and blood pressure Approach: midline Location: L3-L4 Injection technique: LOR saline  Needle:  Needle type: Tuohy  Needle gauge: 17 G Needle length: 9 cm Needle insertion depth: 8 cm Catheter type: closed end flexible Catheter size: 19 Gauge Catheter at skin depth: 14 cm Test dose: negative and 2% lidocaine with Epi 1:200 K  Assessment Events: blood not aspirated, injection not painful, no injection resistance, negative IV test and no paresthesia  Additional Notes H+P and labs checked, risks and benefits discussed with the patient, consent obtained, procedure tolerated well and without complications.  Reason for block:procedure for pain   

## 2014-12-26 NOTE — Anesthesia Preprocedure Evaluation (Signed)
Anesthesia Evaluation  Patient identified by MRN, date of birth, ID band Patient awake    Reviewed: Allergy & Precautions, NPO status , Patient's Chart, lab work & pertinent test results  History of Anesthesia Complications Negative for: history of anesthetic complications  Airway Mallampati: III  TM Distance: >3 FB Neck ROM: Full    Dental  (+) Teeth Intact   Pulmonary neg shortness of breath, neg sleep apnea, neg COPDneg recent URI, former smoker,  breath sounds clear to auscultation        Cardiovascular negative cardio ROS  Rhythm:Regular     Neuro/Psych negative neurological ROS  negative psych ROS   GI/Hepatic Neg liver ROS, GERD-  Medicated and Controlled,  Endo/Other  diabetes, Gestational  Renal/GU negative Renal ROS     Musculoskeletal   Abdominal   Peds  Hematology   Anesthesia Other Findings   Reproductive/Obstetrics (+) Pregnancy                             Anesthesia Physical Anesthesia Plan  ASA: II  Anesthesia Plan: Epidural   Post-op Pain Management:    Induction:   Airway Management Planned:   Additional Equipment:   Intra-op Plan:   Post-operative Plan:   Informed Consent: I have reviewed the patients History and Physical, chart, labs and discussed the procedure including the risks, benefits and alternatives for the proposed anesthesia with the patient or authorized representative who has indicated his/her understanding and acceptance.     Plan Discussed with: Anesthesiologist  Anesthesia Plan Comments:         Anesthesia Quick Evaluation

## 2014-12-27 ENCOUNTER — Encounter (HOSPITAL_COMMUNITY): Payer: Self-pay | Admitting: *Deleted

## 2014-12-27 LAB — CBC
HCT: 31.7 % — ABNORMAL LOW (ref 36.0–46.0)
HEMOGLOBIN: 10.8 g/dL — AB (ref 12.0–15.0)
MCH: 27.8 pg (ref 26.0–34.0)
MCHC: 34.1 g/dL (ref 30.0–36.0)
MCV: 81.5 fL (ref 78.0–100.0)
Platelets: 207 10*3/uL (ref 150–400)
RBC: 3.89 MIL/uL (ref 3.87–5.11)
RDW: 16.1 % — AB (ref 11.5–15.5)
WBC: 7.8 10*3/uL (ref 4.0–10.5)

## 2014-12-27 MED ORDER — IBUPROFEN 600 MG PO TABS: 600.0000 mg | ORAL_TABLET | Freq: Four times a day (QID) | ORAL | Status: DC

## 2014-12-27 NOTE — Lactation Note (Signed)
This note was copied from the chart of Anna Schwartz. Lactation Consultation Note Mom c/o severe pain during latching. Baby has a wide open flange. Had a vigerous suck, mom yelling in pain. Unlatched nbaby and nipple was pointed like lip stick. Fitted mom w/20NS, latched well and mom stated after a few sucks it was much better. Shells given to evert nipple more to assist evert more for a deeper latch and less pain. Comfort gels given for sore bruised nipples.  Mom BF in football hold stating much better. encouraged to massage breast occasionally during BF. Patient Name: Anna Schwartz Today's Date: 12/27/2014 Reason for consult: Follow-up assessment;Breast/nipple pain   Maternal Data    Feeding Feeding Type: Breast Fed Length of feed: 10 min (still BF)  LATCH Score/Interventions Latch: Grasps breast easily, tongue down, lips flanged, rhythmical sucking.  Audible Swallowing: A few with stimulation Intervention(s): Skin to skin;Hand expression Intervention(s): Alternate breast massage  Type of Nipple: Everted at rest and after stimulation Intervention(s): Shells  Comfort (Breast/Nipple): Filling, red/small blisters or bruises, mild/mod discomfort  Problem noted: Mild/Moderate discomfort Interventions (Mild/moderate discomfort): Breast shields;Comfort gels;Hand massage;Hand expression  Hold (Positioning): Assistance needed to correctly position infant at breast and maintain latch. Intervention(s): Breastfeeding basics reviewed;Support Pillows;Position options;Skin to skin  LATCH Score: 7  Lactation Tools Discussed/Used Tools: Shells;Nipple Shields;Comfort gels Nipple shield size: 20 Shell Type: Inverted   Consult Status Consult Status: Follow-up Date: 12/28/14 Follow-up type: In-patient    Charyl DancerCARVER, Trinidee Schrag G 12/27/2014, 12:38 PM

## 2014-12-27 NOTE — Telephone Encounter (Signed)
Note opened in error.

## 2014-12-27 NOTE — Progress Notes (Signed)
Postpartum day #01, NSVD  Subjective Pt without complaints.  Lochia normal.  Pain controlled.  Breast feeding yes.  Pain with latching.  C/o gas pain.  Has not had a BM.  Temp:  [97.5 F (36.4 C)-98.4 F (36.9 C)] 98 F (36.7 C) (05/03 0607) Pulse Rate:  [82-103] 83 (05/03 0607) Resp:  [16-18] 18 (05/03 0607) BP: (113-137)/(68-85) 113/71 mmHg (05/03 0607)  Gen:  NAD, A&O x 3 Uterine fundus:  Firm, nontender Lochia normal Ext:  +Edema, no calf tenderness bilaterally  CBC    Component Value Date/Time   WBC 7.8 12/27/2014 0539   RBC 3.89 12/27/2014 0539   HGB 10.8* 12/27/2014 0539   HCT 31.7* 12/27/2014 0539   PLT 207 12/27/2014 0539   MCV 81.5 12/27/2014 0539   MCH 27.8 12/27/2014 0539   MCHC 34.1 12/27/2014 0539   RDW 16.1* 12/27/2014 0539   LYMPHSABS 1.4 12/02/2014 1830   MONOABS 0.5 12/02/2014 1830   EOSABS 0.1 12/02/2014 1830   BASOSABS 0.0 12/02/2014 1830     A/P: S/p SVD doing well. Routine postpartum care. Lactation support. Nipple cream. Dulcolax suppository prn constipation Discharge in am.  Anna Schwartz 12/27/2014, 7:04 AM

## 2014-12-27 NOTE — Discharge Instructions (Signed)

## 2014-12-28 ENCOUNTER — Inpatient Hospital Stay (HOSPITAL_COMMUNITY): Admission: RE | Admit: 2014-12-28 | Payer: BLUE CROSS/BLUE SHIELD | Source: Ambulatory Visit

## 2014-12-28 NOTE — Progress Notes (Signed)
Postpartum day #2, NSVD  Subjective Pt without complaints, doing well and reports no acute complaints.  No F/C/CP/SOB.  Lochia improving, minimal.  Pain controlled.  Breast feeding yes.  +flatus, +BM.  Ambulating and voiding without difficulty.  Temp:  [98.3 F (36.8 C)-98.5 F (36.9 C)] 98.5 F (36.9 C) (05/04 0536) Pulse Rate:  [87-96] 87 (05/04 0536) Resp:  [18] 18 (05/04 0536) BP: (114-141)/(68-74) 114/68 mmHg (05/04 0536)  Gen:  NAD, A&O x 3 CV: RRR Lungs: CTAB Abd: soft, non-tender, uterus firm, below umbilicus, non-tender Ext:  1+ non-pitting edema, no calf tenderness bilaterally  Labs:  CBC Latest Ref Rng 12/27/2014 12/25/2014 12/02/2014  WBC 4.0 - 10.5 K/uL 7.8 5.7 5.9  Hemoglobin 12.0 - 15.0 g/dL 10.8(L) 12.4 12.1  Hematocrit 36.0 - 46.0 % 31.7(L) 35.3(L) 34.8(L)  Platelets 150 - 400 K/uL 207 213 221    A/P: 33yo G2P1011 s/p NSVD, PPD#2 -pain well controlled -tolerating gen diet -breastfeeding without difficulty, pt seen by lactation -meeting postpartum milestones appropriately, plan for discharge home today  Myna HidalgoJennifer Shukri Nistler, DO [phone] (pager) [phone] (office)

## 2014-12-29 NOTE — Discharge Summary (Signed)
Obstetric Discharge Summary Reason for Admission: onset of labor Prenatal Procedures: none Intrapartum Procedures: spontaneous vaginal delivery and GBS prophylaxis Postpartum Procedures: none Complications-Operative and Postpartum: 2nd degree perineal laceration  Date of Admission: 12/25/14 Date of Discharge: 12/28/14 HEMOGLOBIN  Date Value Ref Range Status  12/27/2014 10.8* 12.0 - 15.0 g/dL Final   HCT  Date Value Ref Range Status  12/27/2014 31.7* 36.0 - 46.0 % Final   Hospital Course:  Anna CrandallLavona J Schwartz is a 34 y.o. female, G2P0010 at 8140 3/7 weeks, presenting for UCs since early am, now q 5-7 min. Denies leaking or bleeding, reports +FM. 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 Reports CBGs have been WNL, diet-controlled.  Patient Active Problem List   Diagnosis Date Noted  . Normal labor 12/25/2014  . Routine cultures positive for HSV2 12/25/2014  . Gestational diabetes mellitus 12/25/2014  . Positive GBS test 12/25/2014    OB Hx: [facility] Dr. Charlotta Newtonzan. A1 GDM--diet controlled, dx at 34 weeks due to glycosuria Normal anatomy US at 20 weeks HSV--no recent/current outbreak, on Valtrex suppression since 36 weeks. GBS positive at 36 weeks     Though the patient was only 1cm, she was admitted for a Cat. II tracing.  Pt was augmented with Pitocin, IV PCN for GBS prophylaxis and received an epidural for pain management.  She progressed to complete and was further augmented with AROM.  NSVD performed by Gerrit HeckJessica Emly, CNM of a viable female infant.  For further information, please see the delivery summary.  Her postpartum course was uncomplicated and she was discharged home in stable condition on PPD#2.  Physical Exam:  Gen: NAD, A&O x 3 CV: RRRLungs: CTAB Abd: soft, non-tender, uterus firm, below umbilicus, non-tender Ext: 1+ non-pitting edema, no calf tenderness bilaterally, no evidence of DVT  Discharge  Diagnoses: Term Pregnancy-delivered  Discharge Information: Date: 12/29/2014 Activity: pelvic rest Diet: routine Medications: PNV and Ibuprofen Condition: stable Instructions: refer to practice specific booklet Discharge to: home Follow-up Information    Follow up with Myna HidalgoZAN, Deniece Rankin, M, DO In 6 weeks.   Specialty:  Obstetrics and Gynecology   Contact information:   [address] [place] [address] [phone]       Newborn Data: Live born female  Birth Weight: 7 lb 12.3 oz (3525 g) APGAR: 8, 9  Home with mother.  Myna HidalgoZAN, Nyeisha Goodall, M 12/29/2014, 8:52 AM

## 2015-10-14 ENCOUNTER — Encounter: Payer: Self-pay | Admitting: Family Medicine

## 2015-10-14 ENCOUNTER — Ambulatory Visit (INDEPENDENT_AMBULATORY_CARE_PROVIDER_SITE_OTHER): Payer: BLUE CROSS/BLUE SHIELD | Admitting: Family Medicine

## 2015-10-14 VITALS — BP 100/70 | HR 89 | Temp 99.1°F | Resp 16 | Ht 63.0 in | Wt 184.0 lb

## 2015-10-14 DIAGNOSIS — R197 Diarrhea, unspecified: Secondary | ICD-10-CM | POA: Diagnosis not present

## 2015-10-14 LAB — POCT CBC
Granulocyte percent: 51.8 %G (ref 37–80)
HCT, POC: 37.9 % (ref 37.7–47.9)
Hemoglobin: 12.9 g/dL (ref 12.2–16.2)
Lymph, poc: 1.6 (ref 0.6–3.4)
MCH, POC: 27 pg (ref 27–31.2)
MCHC: 33.9 g/dL (ref 31.8–35.4)
MCV: 79.8 fL — AB (ref 80–97)
MID (cbc): 0.3 (ref 0–0.9)
MPV: 6.3 fL (ref 0–99.8)
POC Granulocyte: 2 (ref 2–6.9)
POC LYMPH PERCENT: 40.9 %L (ref 10–50)
POC MID %: 7.3 %M (ref 0–12)
Platelet Count, POC: 326 10*3/uL (ref 142–424)
RBC: 4.76 M/uL (ref 4.04–5.48)
RDW, POC: 13.9 %
WBC: 3.8 10*3/uL — AB (ref 4.6–10.2)

## 2015-10-14 MED ORDER — LOPERAMIDE HCL 2 MG PO TABS: 2.0000 mg | ORAL_TABLET | Freq: Four times a day (QID) | ORAL | Status: DC | PRN

## 2015-10-14 NOTE — Progress Notes (Signed)
 @  By signing my name below, I, Anna Schwartz, attest that this documentation has been prepared under the direction and in the presence of Elvina Sidle, MD.  Electronically Signed: Andrew Au, ED Scribe. 10/14/2015. 3:55 PM.  Patient ID: Anna Schwartz MRN: [id number], DOB: February 09, 1981, 35 y.o. Date of Encounter: 10/14/2015, 3:55 PM  Primary Physician: No primary care provider on file.  Chief Complaint:  Chief Complaint  Patient presents with  . Abdominal Pain    x 5 days  . Emesis  . Diarrhea    HPI: 35 y.o. year old female with history below presents with persistent diarrhea.  Pt states symptoms started with diarrhea, nausea, emesis and fever 5 days ago. States symptoms of fever and emesis has improved but she stills has persistent diarrhea and nausea along with decreased appetite. She notes after eating her stomach begins to cramp. She denies sick contacts at home. Pt has a 58 month old at home. She is no longer breat feeding. She recently restarted taking vitamins.  Past Medical History  Diagnosis Date  . Gestational diabetes mellitus, antepartum   . GERD (gastroesophageal reflux disease)   . Routine cultures positive for HSV2   . Hx of varicella   . Gestational diabetes     diet controlled  . Hx of chlamydia infection     as a teenager     Home Meds: Prior to Admission medications   Medication Sig Start Date End Date Taking? Authorizing Provider  ibuprofen (ADVIL,MOTRIN) 600 MG tablet Take 1 tablet (600 mg total) by mouth every 6 (six) hours. Patient not taking: Reported on 10/14/2015 12/27/14   Myna Hidalgo, DO  Prenatal Vit-Fe Fumarate-FA (PRENATAL MULTIVITAMIN) TABS tablet Take 1 tablet by mouth daily at 12 noon. Reported on 10/14/2015    Historical Provider, MD  Ranitidine HCl (ZANTAC PO) Take 1 tablet by mouth daily as needed (indigestion). Reported on 10/14/2015    Historical Provider, MD    Allergies: No Known Allergies  Social History   Social History  .  Marital Status: Married    Spouse Name: N/A  . Number of Children: N/A  . Years of Education: N/A   Occupational History  . Not on file.   Social History Main Topics  . Smoking status: Former Games developer  . Smokeless tobacco: Never Used     Comment: 2012  . Alcohol Use: No  . Drug Use: No  . Sexual Activity: Yes   Other Topics Concern  . Not on file   Social History Narrative     Review of Systems: Constitutional: negative for chills, fever, night sweats, weight changes, or fatigue  HEENT: negative for vision changes, hearing loss, congestion, rhinorrhea, ST, epistaxis, or sinus pressure Cardiovascular: negative for chest pain or palpitations Respiratory: negative for hemoptysis, wheezing, shortness of breath. Abdominal: negative for constipation Dermatological: negative for rash Neurologic: negative for headache, dizziness, or syncope All other systems reviewed and are otherwise negative with the exception to those above and in the HPI.   Physical Exam: Blood pressure 100/70, pulse 89, temperature 99.1 F (37.3 C), temperature source Oral, resp. rate 16, height  (1.6 m), weight 184 lb (83.462 kg), SpO2 98 %, unknown if currently breastfeeding., Body mass index is 32.6 kg/(m^2). General: Well developed, well nourished, in no acute distress. Head: Normocephalic, atraumatic, eyes without discharge, sclera non-icteric, nares are without discharge. Bilateral auditory canals clear, TM's are without perforation, pearly grey and translucent with reflective cone of light bilaterally. Oral cavity moist,  posterior pharynx without exudate, erythema, peritonsillar abscess, or post nasal drip.  Neck: Supple. No thyromegaly. Full ROM. No lymphadenopathy. Lungs: Clear bilaterally to auscultation without wheezes, rales, or rhonchi. Breathing is unlabored. Heart: RRR with S1 S2. No murmurs, rubs, or gallops appreciated. Abdomen: Soft,  Mildly tender over the bladder (pt feels as if she needs  to empty her bladder.), non-distended with normoactive bowel sounds. No hepatomegaly. No rebound/guarding. No obvious abdominal masses. Msk:  Strength and tone normal for age. Extremities/Skin: Warm and dry. No clubbing or cyanosis. No edema. No rashes or suspicious lesions. Neuro: Alert and oriented X 3. Moves all extremities spontaneously. Gait is normal. CNII-XII grossly in tact. Psych:  Responds to questions appropriately with a normal affect.   Labs: Results for orders placed or performed in visit on 10/14/15  POCT CBC  Result Value Ref Range   WBC 3.8 (A) 4.6 - 10.2 K/uL   Lymph, poc 1.6 0.6 - 3.4   POC LYMPH PERCENT 40.9 10 - 50 %L   MID (cbc) 0.3 0 - 0.9   POC MID % 7.3 0 - 12 %M   POC Granulocyte 2.0 2 - 6.9   Granulocyte percent 51.8 37 - 80 %G   RBC 4.76 4.04 - 5.48 M/uL   Hemoglobin 12.9 12.2 - 16.2 g/dL   HCT, POC 16.1 09.6 - 47.9 %   MCV 79.8 (A) 80 - 97 fL   MCH, POC 27.0 27 - 31.2 pg   MCHC 33.9 31.8 - 35.4 g/dL   RDW, POC 04.5 %   Platelet Count, POC 326 142 - 424 K/uL   MPV 6.3 0 - 99.8 fL    ASSESSMENT AND PLAN:  35 y.o. year old female with viral gastroenteritis This chart was scribed in my presence and reviewed by me personally.    ICD-9-CM ICD-10-CM   1. Diarrhea, unspecified type 787.91 R19.7 POCT CBC     COMPLETE METABOLIC PANEL WITH GFR   Also I want you to take Align probiotic   Signed, Elvina Sidle, MD 10/14/2015 3:55 PM

## 2015-10-14 NOTE — Patient Instructions (Signed)
I would like you to pickup at the pharmacy over-the-counter Align and take it twice a day. This is a probiotic which will reestablish the normal bacteria in your intestines and help return in your bowel function to normal.

## 2015-10-15 LAB — COMPLETE METABOLIC PANEL WITH GFR
ALT: 14 U/L (ref 6–29)
AST: 16 U/L (ref 10–30)
Albumin: 3.8 g/dL (ref 3.6–5.1)
Alkaline Phosphatase: 52 U/L (ref 33–115)
BUN: 10 mg/dL (ref 7–25)
CO2: 23 mmol/L (ref 20–31)
Calcium: 8.1 mg/dL — ABNORMAL LOW (ref 8.6–10.2)
Chloride: 107 mmol/L (ref 98–110)
Creat: 0.89 mg/dL (ref 0.50–1.10)
GFR, Est African American: >89 mL/min (ref >=60)
GFR, Est Non African American: 86 mL/min (ref >=60)
Glucose, Bld: 73 mg/dL (ref 65–99)
Potassium: 3.8 mmol/L (ref 3.5–5.3)
Sodium: 137 mmol/L (ref 135–146)
Total Bilirubin: 0.2 mg/dL (ref 0.2–1.2)
Total Protein: 6.5 g/dL (ref 6.1–8.1)

## 2016-02-09 ENCOUNTER — Other Ambulatory Visit (HOSPITAL_COMMUNITY)
Admission: RE | Admit: 2016-02-09 | Discharge: 2016-02-09 | Disposition: A | Discharge status: Home / Self Care | Payer: BLUE CROSS/BLUE SHIELD | Source: Ambulatory Visit | Attending: Obstetrics & Gynecology | Admitting: Obstetrics & Gynecology

## 2016-02-09 ENCOUNTER — Other Ambulatory Visit: Payer: Self-pay | Admitting: Obstetrics & Gynecology

## 2016-02-09 DIAGNOSIS — Z01419 Encounter for gynecological examination (general) (routine) without abnormal findings: Secondary | ICD-10-CM | POA: Insufficient documentation

## 2016-02-09 DIAGNOSIS — Z1151 Encounter for screening for human papillomavirus (HPV): Secondary | ICD-10-CM | POA: Insufficient documentation

## 2016-02-13 LAB — CYTOLOGY - PAP

## 2016-07-10 LAB — OB RESULTS CONSOLE RUBELLA ANTIBODY, IGM: Rubella: IMMUNE

## 2016-07-10 LAB — OB RESULTS CONSOLE ABO/RH: RH TYPE: POSITIVE

## 2016-07-10 LAB — OB RESULTS CONSOLE HEPATITIS B SURFACE ANTIGEN: Hepatitis B Surface Ag: NEGATIVE

## 2016-07-10 LAB — OB RESULTS CONSOLE GC/CHLAMYDIA
Chlamydia: NEGATIVE
GC PROBE AMP, GENITAL: NEGATIVE

## 2016-07-10 LAB — OB RESULTS CONSOLE RPR: RPR: NONREACTIVE

## 2016-07-10 LAB — OB RESULTS CONSOLE HIV ANTIBODY (ROUTINE TESTING): HIV: NONREACTIVE

## 2016-07-10 LAB — OB RESULTS CONSOLE ANTIBODY SCREEN: ANTIBODY SCREEN: NEGATIVE

## 2016-08-26 NOTE — L&D Delivery Note (Signed)
Delivery Note At 6:58 AM a viable female was delivered via Vaginal, Spontaneous Delivery (Presentation: occiput anterior ;  ).  APGAR: 8, 9; weightPending   .   Placenta status: intact , 3 vessel Cord:  with the following complications: None.  Cord pH: NA  Anesthesia: Epidural   Episiotomy: None Lacerations: Periurethral Suture Repair: 3.0 vicryl Est. Blood Loss (mL): 100  Mom to postpartum.  Baby to Couplet care / Skin to Skin.  Chon Buhl J. 03/07/2017, 7:39 AM

## 2016-09-30 DIAGNOSIS — Z3480 Encounter for supervision of other normal pregnancy, unspecified trimester: Secondary | ICD-10-CM | POA: Diagnosis not present

## 2016-10-28 DIAGNOSIS — Z36 Encounter for antenatal screening for chromosomal anomalies: Secondary | ICD-10-CM | POA: Diagnosis not present

## 2016-10-28 DIAGNOSIS — Z3482 Encounter for supervision of other normal pregnancy, second trimester: Secondary | ICD-10-CM | POA: Diagnosis not present

## 2016-11-26 DIAGNOSIS — Z3480 Encounter for supervision of other normal pregnancy, unspecified trimester: Secondary | ICD-10-CM | POA: Diagnosis not present

## 2016-11-26 DIAGNOSIS — Z3482 Encounter for supervision of other normal pregnancy, second trimester: Secondary | ICD-10-CM | POA: Diagnosis not present

## 2016-12-23 DIAGNOSIS — Z23 Encounter for immunization: Secondary | ICD-10-CM | POA: Diagnosis not present

## 2016-12-23 DIAGNOSIS — Z3483 Encounter for supervision of other normal pregnancy, third trimester: Secondary | ICD-10-CM | POA: Diagnosis not present

## 2017-01-27 DIAGNOSIS — O329XX0 Maternal care for malpresentation of fetus, unspecified, not applicable or unspecified: Secondary | ICD-10-CM | POA: Diagnosis not present

## 2017-01-27 DIAGNOSIS — Z3A34 34 weeks gestation of pregnancy: Secondary | ICD-10-CM | POA: Diagnosis not present

## 2017-02-07 DIAGNOSIS — Z3483 Encounter for supervision of other normal pregnancy, third trimester: Secondary | ICD-10-CM | POA: Diagnosis not present

## 2017-02-23 DIAGNOSIS — S53032A Nursemaid's elbow, left elbow, initial encounter: Secondary | ICD-10-CM | POA: Diagnosis not present

## 2017-03-04 ENCOUNTER — Telehealth (HOSPITAL_COMMUNITY): Payer: Self-pay | Admitting: *Deleted

## 2017-03-04 ENCOUNTER — Encounter (HOSPITAL_COMMUNITY): Payer: Self-pay | Admitting: *Deleted

## 2017-03-04 LAB — OB RESULTS CONSOLE GBS: STREP GROUP B AG: NEGATIVE

## 2017-03-04 NOTE — Telephone Encounter (Signed)
Preadmission screen  

## 2017-03-06 ENCOUNTER — Encounter (HOSPITAL_COMMUNITY): Payer: Self-pay

## 2017-03-06 ENCOUNTER — Inpatient Hospital Stay (HOSPITAL_COMMUNITY)
Admission: AD | Admit: 2017-03-06 | Discharge: 2017-03-06 | Disposition: A | Discharge status: Home / Self Care | Payer: BLUE CROSS/BLUE SHIELD | Source: Ambulatory Visit | Attending: Obstetrics & Gynecology | Admitting: Obstetrics & Gynecology

## 2017-03-06 ENCOUNTER — Encounter (HOSPITAL_COMMUNITY): Payer: Self-pay | Admitting: *Deleted

## 2017-03-06 DIAGNOSIS — O479 False labor, unspecified: Secondary | ICD-10-CM

## 2017-03-06 DIAGNOSIS — O24419 Gestational diabetes mellitus in pregnancy, unspecified control: Secondary | ICD-10-CM

## 2017-03-06 DIAGNOSIS — B951 Streptococcus, group B, as the cause of diseases classified elsewhere: Secondary | ICD-10-CM

## 2017-03-06 DIAGNOSIS — B009 Herpesviral infection, unspecified: Secondary | ICD-10-CM

## 2017-03-06 MED ORDER — OXYCODONE-ACETAMINOPHEN 5-325 MG PO TABS: 1.0000 | ORAL_TABLET | Freq: Four times a day (QID) | ORAL | 0 refills | Status: DC | PRN

## 2017-03-06 MED ORDER — LACTATED RINGERS IV BOLUS (SEPSIS)
1000.0000 mL | Freq: Once | INTRAVENOUS | Status: AC
  Administered 2017-03-06: 1000 mL via INTRAVENOUS

## 2017-03-06 MED ORDER — OXYCODONE-ACETAMINOPHEN 5-325 MG PO TABS
2.0000 | ORAL_TABLET | Freq: Once | ORAL | Status: AC
  Administered 2017-03-06: 2 via ORAL
  Filled 2017-03-06: qty 2

## 2017-03-06 NOTE — MAU Note (Signed)
Was here earlier for labor check. Went home and contractions got worse. Denies any vaginal bleeding or leaking.

## 2017-03-06 NOTE — Discharge Instructions (Signed)

## 2017-03-06 NOTE — Progress Notes (Addendum)
G2P1 @ 40.[redacted] wksga. Presents to triage for r/o labor. States ctx q10-15 mins with loss of mucus plug. Denies LOF or bleeding. +FM. EFM applied. Urine collected and sent to lab  1325: SVE closed/thick/high and vertex. No bleeding on exam. Noted mucus plug discharge on glove  1345: Provider notified. Report status of pt given. Orders received to discharge pt home on labor precaution.   1355: MD called to unit. Ordered to keep pt on longer due to a variable earlier during monitoring. EFM applied after pt up to use bathroom.  1402: EFM applied  1425: Dr[facility] Charlotta Newtonzan [facility] reassuring and reactive. Orders to discharge pt home.   1500: Discharge instructions given with pt understanding. Pt left unit via ambulatory with family.

## 2017-03-06 NOTE — MAU Note (Signed)
Pt reports she is having ctx since last night about 10 min. Denies and vag bleeding or leaking of fluid. Good fetal movement reported.

## 2017-03-06 NOTE — MAU Note (Signed)
Urine in lab 

## 2017-03-07 ENCOUNTER — Inpatient Hospital Stay (HOSPITAL_COMMUNITY)
Admission: AD | Admit: 2017-03-07 | Discharge: 2017-03-09 | DRG: 774 | Disposition: A | Discharge status: Home / Self Care | Payer: BLUE CROSS/BLUE SHIELD | Source: Ambulatory Visit | Attending: Obstetrics and Gynecology | Admitting: Obstetrics and Gynecology

## 2017-03-07 ENCOUNTER — Inpatient Hospital Stay (HOSPITAL_COMMUNITY): Payer: BLUE CROSS/BLUE SHIELD | Admitting: Anesthesiology

## 2017-03-07 ENCOUNTER — Encounter (HOSPITAL_COMMUNITY): Payer: Self-pay

## 2017-03-07 DIAGNOSIS — Z23 Encounter for immunization: Secondary | ICD-10-CM | POA: Diagnosis not present

## 2017-03-07 DIAGNOSIS — Z3A40 40 weeks gestation of pregnancy: Secondary | ICD-10-CM

## 2017-03-07 DIAGNOSIS — O24420 Gestational diabetes mellitus in childbirth, diet controlled: Principal | ICD-10-CM | POA: Diagnosis present

## 2017-03-07 DIAGNOSIS — K219 Gastro-esophageal reflux disease without esophagitis: Secondary | ICD-10-CM | POA: Diagnosis not present

## 2017-03-07 DIAGNOSIS — Z87891 Personal history of nicotine dependence: Secondary | ICD-10-CM | POA: Diagnosis not present

## 2017-03-07 DIAGNOSIS — O9832 Other infections with a predominantly sexual mode of transmission complicating childbirth: Secondary | ICD-10-CM | POA: Diagnosis not present

## 2017-03-07 DIAGNOSIS — A6000 Herpesviral infection of urogenital system, unspecified: Secondary | ICD-10-CM | POA: Diagnosis not present

## 2017-03-07 DIAGNOSIS — Z3493 Encounter for supervision of normal pregnancy, unspecified, third trimester: Secondary | ICD-10-CM | POA: Diagnosis not present

## 2017-03-07 DIAGNOSIS — O9962 Diseases of the digestive system complicating childbirth: Secondary | ICD-10-CM | POA: Diagnosis present

## 2017-03-07 LAB — COMPREHENSIVE METABOLIC PANEL
ALBUMIN: 3 g/dL — AB (ref 3.5–5.0)
ALT: 19 U/L (ref 14–54)
AST: 25 U/L (ref 15–41)
Alkaline Phosphatase: 139 U/L — ABNORMAL HIGH (ref 38–126)
Anion gap: 10 (ref 5–15)
BUN: 8 mg/dL (ref 6–20)
CHLORIDE: 104 mmol/L (ref 101–111)
CO2: 19 mmol/L — AB (ref 22–32)
CREATININE: 0.61 mg/dL (ref 0.44–1.00)
Calcium: 8.7 mg/dL — ABNORMAL LOW (ref 8.9–10.3)
GFR calc non Af Amer: >60 mL/min (ref >60)
GLUCOSE: 102 mg/dL — AB (ref 65–99)
Potassium: 4.1 mmol/L (ref 3.5–5.1)
SODIUM: 133 mmol/L — AB (ref 135–145)
Total Bilirubin: 0.5 mg/dL (ref 0.3–1.2)
Total Protein: 6.3 g/dL — ABNORMAL LOW (ref 6.5–8.1)

## 2017-03-07 LAB — CBC
HCT: 37.4 % (ref 36.0–46.0)
HEMOGLOBIN: 13.1 g/dL (ref 12.0–15.0)
MCH: 27.8 pg (ref 26.0–34.0)
MCHC: 35 g/dL (ref 30.0–36.0)
MCV: 79.4 fL (ref 78.0–100.0)
PLATELETS: 223 10*3/uL (ref 150–400)
RBC: 4.71 MIL/uL (ref 3.87–5.11)
RDW: 15.5 % (ref 11.5–15.5)
WBC: 10.5 10*3/uL (ref 4.0–10.5)

## 2017-03-07 LAB — TYPE AND SCREEN
ABO/RH(D): O POS
Antibody Screen: NEGATIVE

## 2017-03-07 LAB — RPR: RPR: NONREACTIVE

## 2017-03-07 LAB — PROTEIN / CREATININE RATIO, URINE
Creatinine, Urine: 95 mg/dL
PROTEIN CREATININE RATIO: 0.13 mg/mg{creat} (ref 0.00–0.15)
Total Protein, Urine: 12 mg/dL

## 2017-03-07 LAB — LACTATE DEHYDROGENASE: LDH: 168 U/L (ref 98–192)

## 2017-03-07 LAB — URIC ACID: Uric Acid, Serum: 3.8 mg/dL (ref 2.3–6.6)

## 2017-03-07 MED ORDER — METHYLERGONOVINE MALEATE 0.2 MG PO TABS: 0.2000 mg | ORAL_TABLET | ORAL | Status: DC | PRN

## 2017-03-07 MED ORDER — EPHEDRINE 5 MG/ML INJ
10.0000 mg | INTRAVENOUS | Status: DC | PRN
Start: 2017-03-07 — End: 2017-03-07
  Filled 2017-03-07: qty 2

## 2017-03-07 MED ORDER — PRENATAL MULTIVITAMIN CH
1.0000 | ORAL_TABLET | Freq: Every day | ORAL | Status: DC
  Administered 2017-03-07 – 2017-03-08 (×2): 1 via ORAL
  Filled 2017-03-07 (×2): qty 1

## 2017-03-07 MED ORDER — FLEET ENEMA 7-19 GM/118ML RE ENEM: 1.0000 | ENEMA | RECTAL | Status: DC | PRN

## 2017-03-07 MED ORDER — DIPHENHYDRAMINE HCL 50 MG/ML IJ SOLN: 12.5000 mg | INTRAMUSCULAR | Status: DC | PRN

## 2017-03-07 MED ORDER — FENTANYL 2.5 MCG/ML BUPIVACAINE 1/10 % EPIDURAL INFUSION (WH - ANES)
14.0000 mL/h | INTRAMUSCULAR | Status: DC | PRN
  Administered 2017-03-07: 14 mL/h via EPIDURAL
  Filled 2017-03-07: qty 100

## 2017-03-07 MED ORDER — ONDANSETRON HCL 4 MG/2ML IJ SOLN: 4.0000 mg | INTRAMUSCULAR | Status: DC | PRN

## 2017-03-07 MED ORDER — OXYTOCIN 40 UNITS IN LACTATED RINGERS INFUSION - SIMPLE MED
2.5000 [IU]/h | INTRAVENOUS | Status: DC
  Filled 2017-03-07: qty 1000

## 2017-03-07 MED ORDER — OXYCODONE-ACETAMINOPHEN 5-325 MG PO TABS: 2.0000 | ORAL_TABLET | ORAL | Status: DC | PRN

## 2017-03-07 MED ORDER — OXYTOCIN BOLUS FROM INFUSION
500.0000 mL | Freq: Once | INTRAVENOUS | Status: AC
  Administered 2017-03-07: 500 mL via INTRAVENOUS

## 2017-03-07 MED ORDER — LIDOCAINE HCL (PF) 1 % IJ SOLN
30.0000 mL | INTRAMUSCULAR | Status: DC | PRN
  Filled 2017-03-07: qty 30

## 2017-03-07 MED ORDER — DIPHENHYDRAMINE HCL 25 MG PO CAPS: 25.0000 mg | ORAL_CAPSULE | Freq: Four times a day (QID) | ORAL | Status: DC | PRN

## 2017-03-07 MED ORDER — PHENYLEPHRINE 40 MCG/ML (10ML) SYRINGE FOR IV PUSH (FOR BLOOD PRESSURE SUPPORT)
80.0000 ug | PREFILLED_SYRINGE | INTRAVENOUS | Status: DC | PRN
  Filled 2017-03-07: qty 5

## 2017-03-07 MED ORDER — LIDOCAINE HCL (PF) 1 % IJ SOLN
INTRAMUSCULAR | Status: DC | PRN
  Administered 2017-03-07: 4 mL via EPIDURAL

## 2017-03-07 MED ORDER — IBUPROFEN 600 MG PO TABS
600.0000 mg | ORAL_TABLET | Freq: Four times a day (QID) | ORAL | Status: DC
  Administered 2017-03-07 – 2017-03-09 (×8): 600 mg via ORAL
  Filled 2017-03-07 (×8): qty 1

## 2017-03-07 MED ORDER — COCONUT OIL OIL
1.0000 "application " | TOPICAL_OIL | Status: DC | PRN
  Administered 2017-03-08: 1 via TOPICAL
  Filled 2017-03-07: qty 120

## 2017-03-07 MED ORDER — PHENYLEPHRINE 40 MCG/ML (10ML) SYRINGE FOR IV PUSH (FOR BLOOD PRESSURE SUPPORT)
80.0000 ug | PREFILLED_SYRINGE | INTRAVENOUS | Status: DC | PRN
  Filled 2017-03-07: qty 10
  Filled 2017-03-07: qty 5

## 2017-03-07 MED ORDER — OXYCODONE HCL 5 MG PO TABS: 5.0000 mg | ORAL_TABLET | ORAL | Status: DC | PRN

## 2017-03-07 MED ORDER — WITCH HAZEL-GLYCERIN EX PADS: 1.0000 "application " | MEDICATED_PAD | CUTANEOUS | Status: DC | PRN

## 2017-03-07 MED ORDER — SENNOSIDES-DOCUSATE SODIUM 8.6-50 MG PO TABS
2.0000 | ORAL_TABLET | ORAL | Status: DC
  Administered 2017-03-08 – 2017-03-09 (×2): 2 via ORAL
  Filled 2017-03-07 (×2): qty 2

## 2017-03-07 MED ORDER — ONDANSETRON HCL 4 MG PO TABS: 4.0000 mg | ORAL_TABLET | ORAL | Status: DC | PRN

## 2017-03-07 MED ORDER — OXYCODONE HCL 5 MG PO TABS: 10.0000 mg | ORAL_TABLET | ORAL | Status: DC | PRN

## 2017-03-07 MED ORDER — LACTATED RINGERS IV SOLN: 500.0000 mL | Freq: Once | INTRAVENOUS | Status: DC

## 2017-03-07 MED ORDER — METHYLERGONOVINE MALEATE 0.2 MG/ML IJ SOLN: 0.2000 mg | INTRAMUSCULAR | Status: DC | PRN

## 2017-03-07 MED ORDER — LACTATED RINGERS IV SOLN
INTRAVENOUS | Status: DC
  Administered 2017-03-07: 03:00:00 via INTRAVENOUS

## 2017-03-07 MED ORDER — DIBUCAINE 1 % RE OINT: 1.0000 "application " | TOPICAL_OINTMENT | RECTAL | Status: DC | PRN

## 2017-03-07 MED ORDER — LACTATED RINGERS IV SOLN: 500.0000 mL | INTRAVENOUS | Status: DC | PRN

## 2017-03-07 MED ORDER — BENZOCAINE-MENTHOL 20-0.5 % EX AERO: 1.0000 "application " | INHALATION_SPRAY | CUTANEOUS | Status: DC | PRN

## 2017-03-07 MED ORDER — ZOLPIDEM TARTRATE 5 MG PO TABS: 5.0000 mg | ORAL_TABLET | Freq: Every evening | ORAL | Status: DC | PRN

## 2017-03-07 MED ORDER — ACETAMINOPHEN 325 MG PO TABS: 650.0000 mg | ORAL_TABLET | ORAL | Status: DC | PRN

## 2017-03-07 MED ORDER — SOD CITRATE-CITRIC ACID 500-334 MG/5ML PO SOLN: 30.0000 mL | ORAL | Status: DC | PRN

## 2017-03-07 MED ORDER — SIMETHICONE 80 MG PO CHEW: 80.0000 mg | CHEWABLE_TABLET | ORAL | Status: DC | PRN

## 2017-03-07 MED ORDER — ONDANSETRON HCL 4 MG/2ML IJ SOLN: 4.0000 mg | Freq: Four times a day (QID) | INTRAMUSCULAR | Status: DC | PRN

## 2017-03-07 MED ORDER — OXYCODONE-ACETAMINOPHEN 5-325 MG PO TABS: 1.0000 | ORAL_TABLET | ORAL | Status: DC | PRN

## 2017-03-07 MED ORDER — EPHEDRINE 5 MG/ML INJ
10.0000 mg | INTRAVENOUS | Status: DC | PRN
  Filled 2017-03-07: qty 2

## 2017-03-07 NOTE — Plan of Care (Signed)
Problem: Education: Goal: Knowledge of condition will improve Outcome: Progressing Instructed mother to call for assistance to restroom. Reviewed patient education.

## 2017-03-07 NOTE — Anesthesia Procedure Notes (Signed)
Epidural Patient location during procedure: OB Start time: 03/07/2017 2:54 AM End time: 03/07/2017 3:04 AM  Staffing Anesthesiologist: Shona SimpsonHOLLIS, Legrand Lasser D Performed: anesthesiologist   Preanesthetic Checklist Completed: patient identified, site marked, surgical consent, pre-op evaluation, timeout performed, IV checked, risks and benefits discussed and monitors and equipment checked  Epidural Patient position: sitting Prep: ChloraPrep Patient monitoring: heart rate, continuous pulse ox and blood pressure Approach: midline Location: L3-L4 Injection technique: LOR saline  Needle:  Needle type: Tuohy  Needle gauge: 17 G Needle length: 9 cm Catheter type: closed end flexible Catheter size: 20 Guage Test dose: negative and 1.5% lidocaine  Assessment Events: blood not aspirated, injection not painful, no injection resistance and no paresthesia  Additional Notes LOR @ 5  Patient identified. Risks/Benefits/Options discussed with patient including but not limited to bleeding, infection, nerve damage, paralysis, failed block, incomplete pain control, headache, blood pressure changes, nausea, vomiting, reactions to medications, itching and postpartum back pain. Confirmed with bedside nurse the patient's most recent platelet count. Confirmed with patient that they are not currently taking any anticoagulation, have any bleeding history or any family history of bleeding disorders. Patient expressed understanding and wished to proceed. All questions were answered. Sterile technique was used throughout the entire procedure. Please see nursing notes for vital signs. Test dose was given through epidural catheter and negative prior to continuing to dose epidural or start infusion. Warning signs of high block given to the patient including shortness of breath, tingling/numbness in hands, complete motor block, or any concerning symptoms with instructions to call for help. Patient was given instructions on fall  risk and not to get out of bed. All questions and concerns addressed with instructions to call with any issues or inadequate analgesia.    Reason for block:procedure for pain

## 2017-03-07 NOTE — Anesthesia Postprocedure Evaluation (Signed)
Anesthesia Post Note  Patient: Anna Schwartz  Procedure(s) Performed: * No procedures listed *     Patient location during evaluation: Mother Baby Anesthesia Type: Epidural Level of consciousness: awake and alert, oriented and patient cooperative Pain management: pain level controlled Vital Signs Assessment: post-procedure vital signs reviewed and stable Respiratory status: spontaneous breathing Cardiovascular status: stable Postop Assessment: no headache, epidural receding, patient able to bend at knees and no signs of nausea or vomiting Anesthetic complications: no Comments: Pain  Score 0.    Last Vitals:  Vitals:   03/07/17 0850 03/07/17 0950  BP: (!) 144/79 130/75  Pulse: 100 (!) 109  Resp: 18 18  Temp: 36.8 C 37 C    Last Pain:  Vitals:   03/07/17 1121  TempSrc:   PainSc: 5    Pain Goal:                 [facility] PaWRINKLE,Deriana Vanderhoef

## 2017-03-07 NOTE — H&P (Signed)
Anna Schwartz is a 36 y.o. female G3P011 at 40 wks and 2 days with EDD 02/03/2017 presenting complaining of regular contractions. Cervix 5 cm in MAU. 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 Pt denies any active lesions. Prenatal care provided by Dr. Katha HammingJenny [facility] No LOF no vaginal bleeding. +FM  OB History    Gravida Para Term Preterm AB Living   3 1 1   1 1    SAB TAB Ectopic Multiple Live Births   1     0 1     Past Medical History:  Diagnosis Date  . GERD (gastroesophageal reflux disease)   . Gestational diabetes    diet controlled  . Gestational diabetes mellitus, antepartum   . Hx of chlamydia infection    as a teenager  . Hx of varicella   . Routine cultures positive for HSV2    Past Surgical History:  Procedure Laterality Date  . NO PAST SURGERIES     Family History: family history includes Asthma in her brother; Cancer in her maternal aunt, maternal grandfather, paternal grandfather, and paternal grandmother; Diabetes in her maternal grandmother and mother; Hypertension in her brother and father; Stroke in her maternal grandmother. Social History:  reports that she has quit smoking. She has quit using smokeless tobacco. She reports that she does not drink alcohol or use drugs.     Maternal Diabetes: No Genetic Screening: Normal Maternal Ultrasounds/Referrals: Normal Fetal Ultrasounds or other Referrals:  None Maternal Substance Abuse:  No Significant Maternal Medications:  None Significant Maternal Lab Results: GBS Negative  Other Comments:  None  Review of Systems  Constitutional: Negative.   HENT: Negative.   Eyes: Negative.   Respiratory: Negative.   Cardiovascular: Negative.   Gastrointestinal: Negative.   Genitourinary: Negative.   Musculoskeletal: Negative.   Skin: Negative.   Neurological: Negative.   Endo/Heme/Allergies: Negative.   Psychiatric/Behavioral: Negative.    History Dilation: 7 Effacement (%): 80 Station: 0 Exam by:: Dr.  Richardson Doppole Blood pressure 127/79, pulse (!) 125, temperature 98.1 F (36.7 C), resp. rate (!) 22, height 5\' 2"  (1.575 m), weight 97.5 kg (215 lb), last menstrual period 05/29/2016, SpO2 100 %, unknown if currently breastfeeding. Exam Physical Exam  Vitals reviewed. Constitutional: She is oriented to person, place, and time. She appears well-developed and well-nourished.  HENT:  Head: Normocephalic and atraumatic.  Eyes: Pupils are equal, round, and reactive to light. Conjunctivae are normal.  Cardiovascular: Normal rate and regular rhythm.   Respiratory: Effort normal and breath sounds normal.  GI: There is no tenderness.  Genitourinary: Vagina normal.  Musculoskeletal: Normal range of motion. She exhibits no edema.  Neurological: She is alert and oriented to person, place, and time. She has normal reflexes.  Skin: Skin is warm and dry.  Psychiatric: She has a normal mood and affect.    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 03/07/2017, 3:13 AM

## 2017-03-07 NOTE — Anesthesia Preprocedure Evaluation (Signed)
Anesthesia Evaluation  Patient identified by MRN, date of birth, ID band Patient awake    Reviewed: Allergy & Precautions, Patient's Chart, lab work & pertinent test results  Airway Mallampati: III       Dental no notable dental hx.    Pulmonary former smoker,    Pulmonary exam normal        Cardiovascular  Rhythm:Regular Rate:Normal     Neuro/Psych negative neurological ROS  negative psych ROS   GI/Hepatic GERD  ,  Endo/Other  diabetes, Gestational  Renal/GU      Musculoskeletal   Abdominal   Peds  Hematology   Anesthesia Other Findings   Reproductive/Obstetrics (+) Pregnancy                             Lab Results  Component Value Date   WBC 10.5 03/07/2017   HGB 13.1 03/07/2017   HCT 37.4 03/07/2017   MCV 79.4 03/07/2017   PLT 223 03/07/2017     Anesthesia Physical Anesthesia Plan  ASA: III  Anesthesia Plan: Epidural   Post-op Pain Management:    Induction:   PONV Risk Score and Plan:   Airway Management Planned:   Additional Equipment:   Intra-op Plan:   Post-operative Plan:   Informed Consent: I have reviewed the patients History and Physical, chart, labs and discussed the procedure including the risks, benefits and alternatives for the proposed anesthesia with the patient or authorized representative who has indicated his/her understanding and acceptance.     Plan Discussed with:   Anesthesia Plan Comments:         Anesthesia Quick Evaluation

## 2017-03-07 NOTE — MAU Note (Signed)
CTX- unsure of how far apart.  No VB or LOF.  Increased discharge.  Was here earlier and cervix was 2 cm dilated.  Reports good FM.

## 2017-03-07 NOTE — Lactation Note (Signed)
This note was copied from a baby's chart. Lactation Consultation Note  Patient Name: Girl Lorna DibbleLavona Macmillan Today's Date: 03/07/2017 Reason for consult: Initial assessment (noted baby/jittery prior  latch , MBU RN, Kristen Minor awar)  Pecola LeisureBaby is 7 hours old, and has been to the breast several times - 10 -30 mins.  Once the baby was feeding,  Jitteriness disappeared. Baby fed 10 mins with few swallows/ depth at the breast  With mom latching with cradle position.  Per mom breast fed 1st baby 6 months and gradually her milk decreased and breast feeding stopped and she had to give  Formula. Per mom comfortable with hand expressing. Colostrum containers provided for hand expressing and spoons.  Mother informed of post-discharge support and given phone number to the lactation department, including services for phone call assistance; out-patient appointments; and breastfeeding support group. List of other breastfeeding resources in the community given in the handout. Encouraged mother to call for problems or concerns related to breastfeeding.   Maternal Data    Feeding Feeding Type: Breast Fed Length of feed: 10 min (swallows noted / LC observed )  LATCH Score/Interventions Latch: Grasps breast easily, tongue down, lips flanged, rhythmical sucking. Intervention(s): Adjust position;Assist with latch;Breast massage;Breast compression  Audible Swallowing: A few with stimulation Intervention(s): Skin to skin  Type of Nipple: Everted at rest and after stimulation  Comfort (Breast/Nipple): Soft / non-tender     Hold (Positioning): Assistance needed to correctly position infant at breast and maintain latch. Intervention(s): Breastfeeding basics reviewed;Position options;Skin to skin  LATCH Score: 8  Lactation Tools Discussed/Used     Consult Status Consult Status: Follow-up Date: 03/08/17 Follow-up type: In-patient    Matilde SprangMargaret Ann Barney Gertsch 03/07/2017, 2:34 PM

## 2017-03-08 LAB — CBC
HEMATOCRIT: 30.8 % — AB (ref 36.0–46.0)
HEMOGLOBIN: 10.7 g/dL — AB (ref 12.0–15.0)
MCH: 28.2 pg (ref 26.0–34.0)
MCHC: 34.7 g/dL (ref 30.0–36.0)
MCV: 81.3 fL (ref 78.0–100.0)
Platelets: 207 10*3/uL (ref 150–400)
RBC: 3.79 MIL/uL — AB (ref 3.87–5.11)
RDW: 15.8 % — ABNORMAL HIGH (ref 11.5–15.5)
WBC: 8.4 10*3/uL (ref 4.0–10.5)

## 2017-03-08 NOTE — Discharge Summary (Signed)
OB Discharge Summary     Patient Name: Anna Schwartz DOB: Nov 22, 1980 MRN: [id number]  Date of admission: 03/07/2017 Delivering MD: Gerald Leitz   Date of delivery:  03/07/17  Date of discharge: 03/09/2017  Admitting diagnosis: 40 WEEKS CTX Intrauterine pregnancy: [redacted]w[redacted]d     Secondary diagnosis:  Principal Problem:   Vaginal delivery  Additional problems: NA     Discharge diagnosis: Term Pregnancy Delivered and GDM A1                                                                                                Post partum procedures:NA  Augmentation: AROM  Complications: None  Hospital course:  Onset of Labor With Vaginal Delivery     36 y.o. yo [id number] at [redacted]w[redacted]d was admitted in Active Labor on 03/07/2017. Patient had an uncomplicated labor course as follows:  Membrane Rupture Time/Date: 6:43 AM ,03/07/2017   Intrapartum Procedures: Episiotomy: None [1]                                         Lacerations:  Periurethral [8]  Patient had a delivery of a Viable infant. 03/07/2017  Information for the patient's newborn:  Schwartz, Anna Anna [[id number]]  Delivery Method: Vag-Spont   Baby jittery at times on day 1, resolved by day 2--normal CBGs, feeding well.  Pateint had an uncomplicated postpartum course.  She is ambulating, tolerating a regular diet, passing flatus, and urinating well. Patient is discharged home in stable condition on 03/09/17.   Physical exam  Vitals:   03/08/17 0011 03/08/17 0607 03/08/17 1730 03/09/17 0627  BP: 116/69 (!) 114/59 123/74 126/62  Pulse: 87 83 99 90  Resp: 18 18 18 18   Temp: 98.7 F (37.1 C) 98.8 F (37.1 C) 98.5 F (36.9 C) 98.2 F (36.8 C)  TempSrc: Oral Oral Oral Oral  SpO2:  100% 100% 100%  Weight:      Height:       General: alert Lochia: appropriate Uterine Fundus: firm Incision: Healing well with no significant drainage DVT Evaluation: No evidence of DVT seen on physical exam. Negative Homan's sign. Labs: Lab Results   Component Value Date   WBC 8.4 03/08/2017   HGB 10.7 (L) 03/08/2017   HCT 30.8 (L) 03/08/2017   MCV 81.3 03/08/2017   PLT 207 03/08/2017   CMP Latest Ref Rng & Units 03/07/2017  Glucose 65 - 99 mg/dL 130(Q)  BUN 6 - 20 mg/dL 8  Creatinine 6.57 - 8.46 mg/dL 9.62  Sodium 952 - 841 mmol/L 133(L)  Potassium 3.5 - 5.1 mmol/L 4.1  Chloride 101 - 111 mmol/L 104  CO2 22 - 32 mmol/L 19(L)  Calcium 8.9 - 10.3 mg/dL 3.2(G)  Total Protein 6.5 - 8.1 g/dL 6.3(L)  Total Bilirubin 0.3 - 1.2 mg/dL 0.5  Alkaline Phos 38 - 126 U/L 139(H)  AST 15 - 41 U/L 25  ALT 14 - 54 U/L 19    Discharge instruction: per After Visit Summary and "Baby and  Me Booklet".  After visit meds:  Allergies as of 03/09/2017   No Known Allergies     Medication List    STOP taking these medications   oxyCODONE-acetaminophen 5-325 MG tablet Commonly known as:  PERCOCET/ROXICET   valACYclovir 500 MG tablet Commonly known as:  VALTREX     TAKE these medications   ibuprofen 600 MG tablet Commonly known as:  ADVIL,MOTRIN Take 1 tablet (600 mg total) by mouth every 6 (six) hours as needed.   prenatal multivitamin Tabs tablet Take 1 tablet by mouth daily at 12 noon. Reported on 10/14/2015   ZANTAC PO Take 150 mg by mouth daily as needed (indigestion). Reported on 10/14/2015       Diet: routine diet  Activity: Advance as tolerated. Pelvic rest for 6 weeks.   Outpatient follow up:6 weeks Follow up Appt:No future appointments. Follow up Visit:No Follow-up on file.  Postpartum contraception: Declines at present--will discuss with MD at pp visit.  Newborn Data: Live born female, "Anna Schwartz" Birth Weight: 6 lb 15.3 oz (3155 g) APGAR: 8, 9  Baby Feeding: Breast Disposition:home with mother   03/09/2017 Anna Schwartz, Anna Schwartz, CNM

## 2017-03-08 NOTE — Discharge Instructions (Signed)

## 2017-03-08 NOTE — Lactation Note (Signed)
This note was copied from a baby's chart. Lactation Consultation Note  Patient Name: Anna Schwartz Today's Date: 03/08/2017 Reason for consult: Follow-up assessment;Difficult latch  Baby 30 hours old. Mom reports that she is having some nipple pain with latching, and that this happened with her first child--now 947 years old.  Mom states that latching eventually became comfortable after using a NS for a few weeks and letting her nipples heal. Mom had been latching in a cradle position, so assisted mom to latch baby to left breast in football position. Baby latched deeply and suckled rhythmically with a few swallows noted. Mom reported increased comfort when baby's lower lip flanged. Baby removed from breast after 5 minutes to examine nipple, and mom's nipple round with a faint compression strip. Fitted mom with a #20 NS and baby latched deeply, but mom reported increased pain. Mom able to hand express with colostrum flowing, but no colostrum in NS. Mom able to latch baby without NS. Mom given a manual pump to evert nipple more, prior to latching. Mom also given comfort gels with review and enc to use for comfort and healing.   Discussed hand expression and spoon-feeding, but mom did not want to do this at this time. Mom states that she has spoons, and may do this later, but she feels latching is more comfortable now. Discussed the need to pump if using NS, and mom states that she has a DEBP at home. Mom aware of OP/BFSG and LC phone line assistance after D/C. Baby was jittery while latching, and Brooke, RN in the room stated that pediatrician aware and baby's glucose checks all within normal range. Brooke, RN aware of this LC's assessment and interventions during LC visit.   Maternal Data    Feeding Feeding Type: Breast Fed Length of feed: 5 min  LATCH Score/Interventions Latch: Grasps breast easily, tongue down, lips flanged, rhythmical sucking. Intervention(s): Adjust position;Assist with  latch;Breast compression  Audible Swallowing: A few with stimulation Intervention(s): Skin to skin;Hand expression  Type of Nipple: Everted at rest and after stimulation  Comfort (Breast/Nipple): Filling, red/small blisters or bruises, mild/mod discomfort (short shaft)  Problem noted: Mild/Moderate discomfort Interventions (Mild/moderate discomfort): Pre-pump if needed;Comfort gels;Post-pump  Hold (Positioning): Assistance needed to correctly position infant at breast and maintain latch. Intervention(s): Breastfeeding basics reviewed;Support Pillows;Position options;Skin to skin  LATCH Score: 7  Lactation Tools Discussed/Used Tools: Nipple Shields;Pump;Comfort gels Nipple shield size: 20 Breast pump type: Manual   Consult Status Consult Status: PRN    Sherlyn HayJennifer D Brinklee Cisse 03/08/2017, 2:44 PM

## 2017-03-08 NOTE — Progress Notes (Signed)
Subjective: Postpartum Day 1: Vaginal delivery, periurethral laceration Patient up ad lib, reports no syncope or dizziness. Feeding: Breast Contraceptive plan:  Undecided at present  Patient was GDM, diet-controlled.  Patient was considering d/c today, but baby had some jitteriness, and patient elected to stay until tomorrow.  Objective: Vital signs in last 24 hours: Temp:  [98.7 F (37.1 C)-98.8 F (37.1 C)] 98.8 F (37.1 C) (07/14 0607) Pulse Rate:  [83-87] 83 (07/14 0607) Resp:  [18] 18 (07/14 0607) BP: (114-116)/(59-69) 114/59 (07/14 0607) SpO2:  [100 %] 100 % (07/14 [id number])  Physical Exam:  General: alert Lochia: appropriate Uterine Fundus: firm Perineum: healing well DVT Evaluation: No evidence of DVT seen on physical exam. Negative Homan's sign.   CBC Latest Ref Rng & Units 03/08/2017 03/07/2017 10/14/2015  WBC 4.0 - 10.5 K/uL 8.4 10.5 3.8(A)  Hemoglobin 12.0 - 15.0 g/dL 10.7(L) 13.1 12.9  Hematocrit 36.0 - 46.0 % 30.8(L) 37.4 37.9  Platelets 150 - 400 K/uL 207 223 -    Assessment/Plan: Status post vaginal delivery day 1. GDM, diet-controlled. Stable Continue current care. Plan for discharge tomorrow   Nigel BridgemanLATHAM, Candido Flott CNM 03/08/2017, 2:17 PM

## 2017-03-09 MED ORDER — IBUPROFEN 600 MG PO TABS: 600.0000 mg | ORAL_TABLET | Freq: Four times a day (QID) | ORAL | 2 refills | Status: AC | PRN

## 2017-03-09 NOTE — Lactation Note (Signed)
This note was copied from a baby'Schwartz chart. Lactation Consultation Note  Patient Name: Anna Schwartz Today'Schwartz Date: 03/09/2017  Mom states feedings are going well.  She is latching baby without nipple shield.  C/o initial latch on pain that eases during feeding.  Reviewed discharge instructions including engorgement treatment.  Mom states the comfort gels are helpful.  Given an extra pair.  Lactation outpatient services and support information reviewed and encouraged prn.   Maternal Data    Feeding Feeding Type: Breast Fed Length of feed: 10 min  LATCH Score/Interventions                      Lactation Tools Discussed/Used     Consult Status      Huston FoleyMOULDEN, Anna Schwartz 03/09/2017, 9:10 AM

## 2017-03-11 ENCOUNTER — Inpatient Hospital Stay (HOSPITAL_COMMUNITY): Admission: RE | Admit: 2017-03-11 | Payer: BLUE CROSS/BLUE SHIELD | Source: Ambulatory Visit

## 2018-04-23 DIAGNOSIS — Z01419 Encounter for gynecological examination (general) (routine) without abnormal findings: Secondary | ICD-10-CM | POA: Diagnosis not present

## 2019-03-18 ENCOUNTER — Other Ambulatory Visit: Payer: Self-pay | Admitting: Family Medicine

## 2019-03-18 ENCOUNTER — Ambulatory Visit
Admission: RE | Admit: 2019-03-18 | Discharge: 2019-03-18 | Disposition: A | Discharge status: Home / Self Care | Payer: BC Managed Care – PPO | Source: Ambulatory Visit | Attending: Family Medicine | Admitting: Family Medicine

## 2019-03-18 DIAGNOSIS — M545 Low back pain, unspecified: Secondary | ICD-10-CM

## 2019-07-19 DIAGNOSIS — Z03818 Encounter for observation for suspected exposure to other biological agents ruled out: Secondary | ICD-10-CM | POA: Diagnosis not present

## 2019-09-13 DIAGNOSIS — Z20828 Contact with and (suspected) exposure to other viral communicable diseases: Secondary | ICD-10-CM | POA: Diagnosis not present

## 2019-11-23 DIAGNOSIS — R61 Generalized hyperhidrosis: Secondary | ICD-10-CM | POA: Diagnosis not present

## 2019-12-03 ENCOUNTER — Ambulatory Visit: Payer: Self-pay | Attending: Internal Medicine

## 2019-12-03 DIAGNOSIS — Z23 Encounter for immunization: Secondary | ICD-10-CM

## 2019-12-03 NOTE — Progress Notes (Signed)
   Covid-19 Vaccination Clinic  Name:  Anna Schwartz    MRN: [id number] DOB: December 02, 1980  12/03/2019  Ms. Harada was observed post Covid-19 immunization for 15 minutes without incident. She was provided with Vaccine Information Sheet and instruction to access the V-Safe system.   Ms. Glasper was instructed to call 911 with any severe reactions post vaccine: Marland Kitchen Difficulty breathing  . Swelling of face and throat  . A fast heartbeat  . A bad rash all over body  . Dizziness and weakness   Immunizations Administered    Name Date Dose VIS Date Route   Pfizer COVID-19 Vaccine 12/03/2019  1:06 PM 0.3 mL 08/06/2019 Intramuscular   Manufacturer: [employer], [employer]   Lot: [id number]   NDC: [address]-7

## 2019-12-17 ENCOUNTER — Ambulatory Visit
Admission: RE | Admit: 2019-12-17 | Discharge: 2019-12-17 | Disposition: A | Discharge status: Home / Self Care | Payer: BC Managed Care – PPO | Source: Ambulatory Visit | Attending: Obstetrics and Gynecology | Admitting: Obstetrics and Gynecology

## 2019-12-17 ENCOUNTER — Other Ambulatory Visit: Payer: Self-pay | Admitting: Obstetrics and Gynecology

## 2019-12-17 ENCOUNTER — Other Ambulatory Visit: Payer: Self-pay

## 2019-12-17 DIAGNOSIS — M25552 Pain in left hip: Secondary | ICD-10-CM

## 2019-12-17 DIAGNOSIS — G8929 Other chronic pain: Secondary | ICD-10-CM

## 2019-12-17 DIAGNOSIS — M545 Low back pain, unspecified: Secondary | ICD-10-CM

## 2019-12-27 ENCOUNTER — Ambulatory Visit: Payer: BC Managed Care – PPO | Attending: Internal Medicine

## 2019-12-27 DIAGNOSIS — Z23 Encounter for immunization: Secondary | ICD-10-CM

## 2019-12-27 NOTE — Progress Notes (Signed)
   Covid-19 Vaccination Clinic  Name:  Anna Schwartz    MRN: [id number] DOB: March 07, 1981  12/27/2019  Anna Schwartz was observed post Covid-19 immunization for 15 minutes without incident. She was provided with Vaccine Information Sheet and instruction to access the V-Safe system.   Anna Schwartz was instructed to call 911 with any severe reactions post vaccine: Marland Kitchen Difficulty breathing  . Swelling of face and throat  . A fast heartbeat  . A bad rash all over body  . Dizziness and weakness   Immunizations Administered    Name Date Dose VIS Date Route   Pfizer COVID-19 Vaccine 12/27/2019 12:06 PM 0.3 mL 10/20/2018 Intramuscular   Manufacturer: [employer], [employer]   Lot: [id number]   NDC: [address]-0

## 2020-05-29 DIAGNOSIS — Z03818 Encounter for observation for suspected exposure to other biological agents ruled out: Secondary | ICD-10-CM | POA: Diagnosis not present

## 2020-05-29 DIAGNOSIS — J029 Acute pharyngitis, unspecified: Secondary | ICD-10-CM | POA: Diagnosis not present

## 2020-05-29 DIAGNOSIS — Z20822 Contact with and (suspected) exposure to covid-19: Secondary | ICD-10-CM | POA: Diagnosis not present

## 2020-08-15 IMAGING — CR LUMBAR SPINE - COMPLETE 4+ VIEW
5 series · 5 of 5 positions shown · non-contrast
Comparison: None.

CLINICAL DATA: Low back pain, unspecified back pain laterality,
unspecified chronicity, unspecified whether sciatica present
(T10-J6-CM).

Patient reports chronic bilateral low back pain and tightness for
year.
EXAM:
LUMBAR SPINE - COMPLETE 4+ VIEW

[t lumbar spine ap]
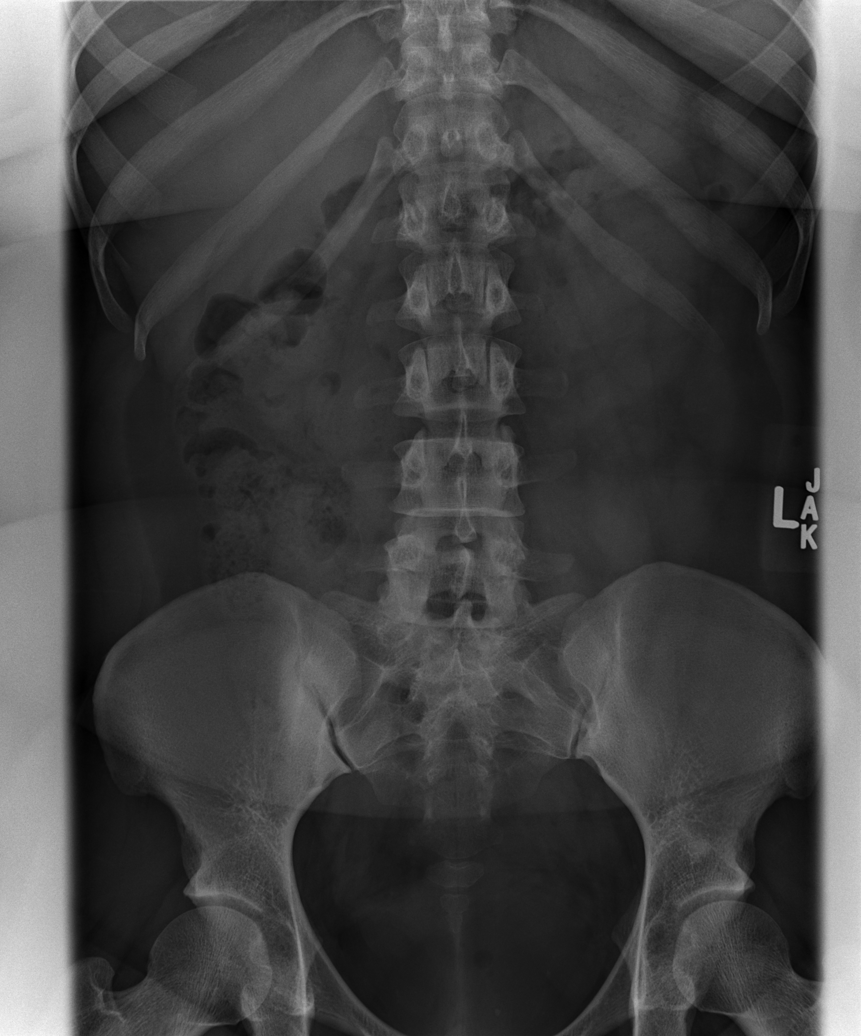

[t lumbar spine obl (1 of 2)]
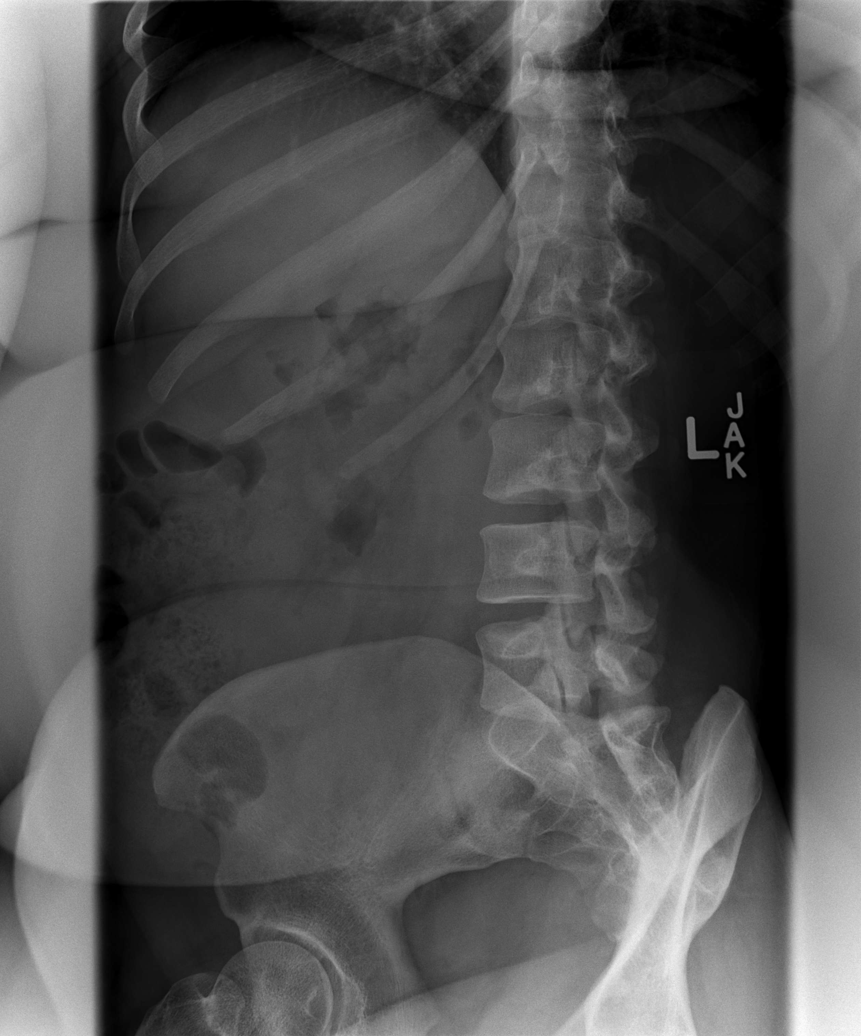

[t lumbar spine obl (2 of 2)]
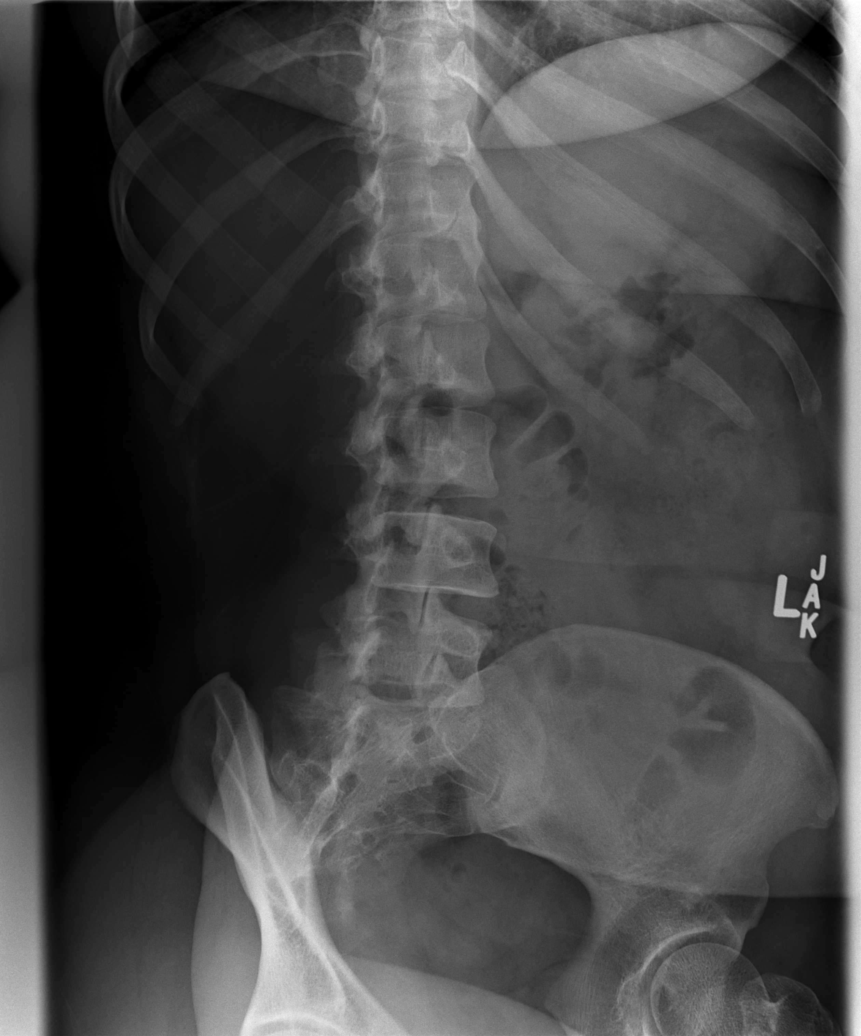

[t lumbar spine lat]
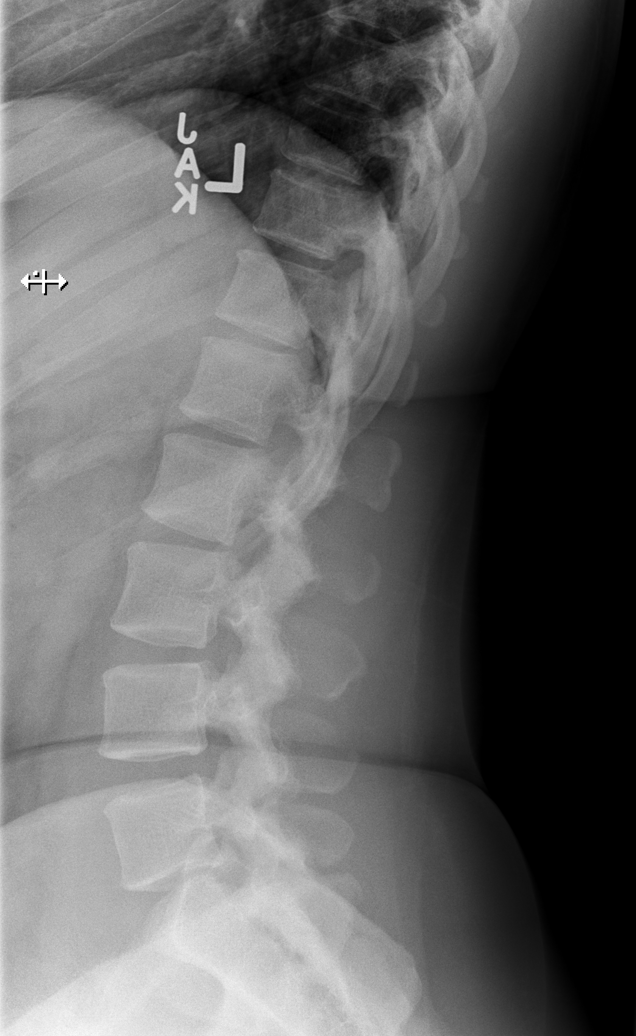

[t lumbar l-5 s-1 spot]
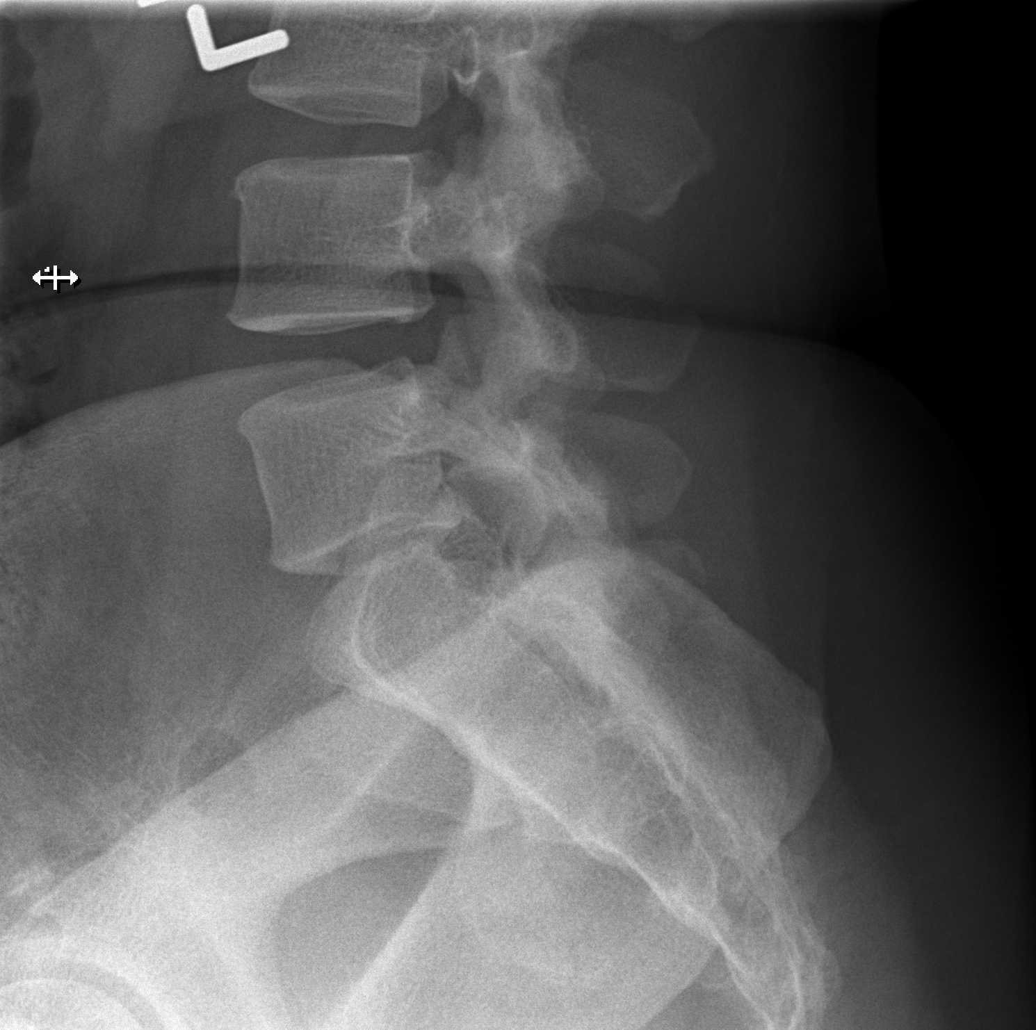

[5 of 5 positions shown; findings below may reference images not displayed]

FINDINGS: The alignment is maintained. Vertebral body heights are normal.
There is no listhesis. The posterior elements are intact. Disc
spaces are preserved. No fracture. Sacroiliac joints are symmetric
and normal.
IMPRESSION: Negative radiographs of the lumbar spine.

## 2021-05-01 DIAGNOSIS — Z1231 Encounter for screening mammogram for malignant neoplasm of breast: Secondary | ICD-10-CM | POA: Diagnosis not present

## 2021-05-16 IMAGING — CR DG HIP (WITH OR WITHOUT PELVIS) 2-3V*L*
2 series · 2 of 2 positions shown · non-contrast
Comparison: None.

CLINICAL DATA: Worsening left hip pain for approximately 2 months.

EXAM:
DG HIP (WITH OR WITHOUT PELVIS) 2-3V LEFT

[t hip ap left (1 of 2)]
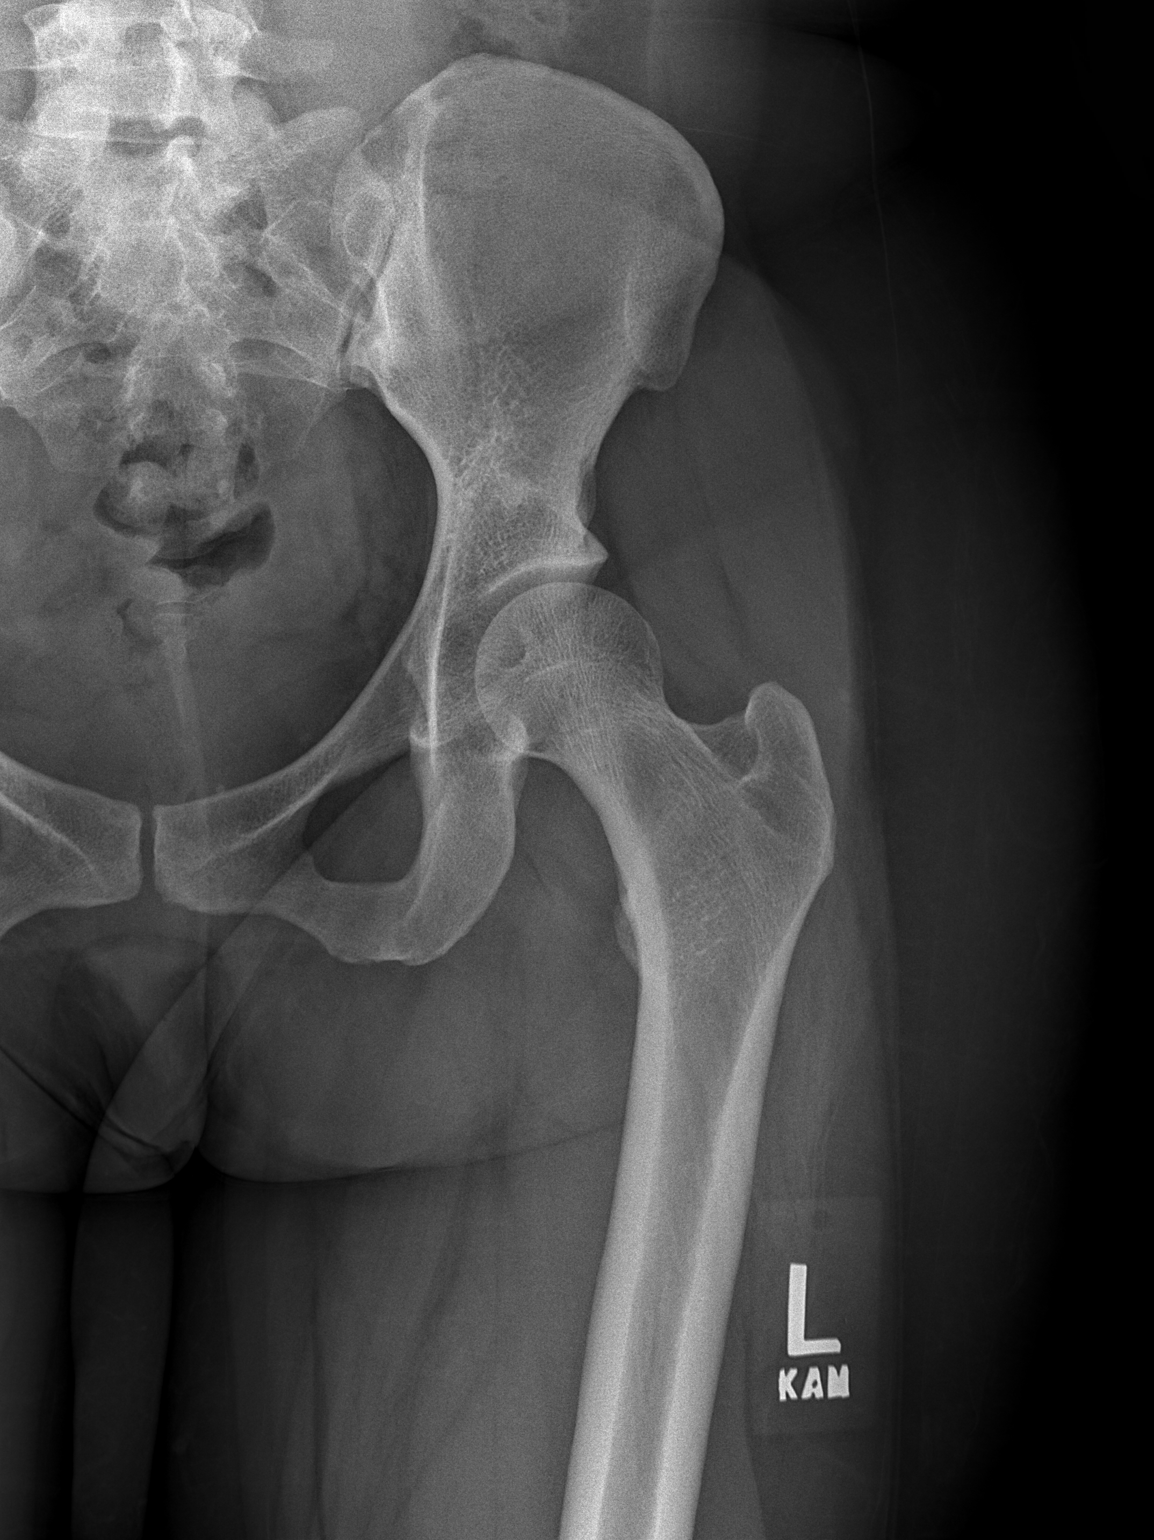

[t hip ap left (2 of 2)]
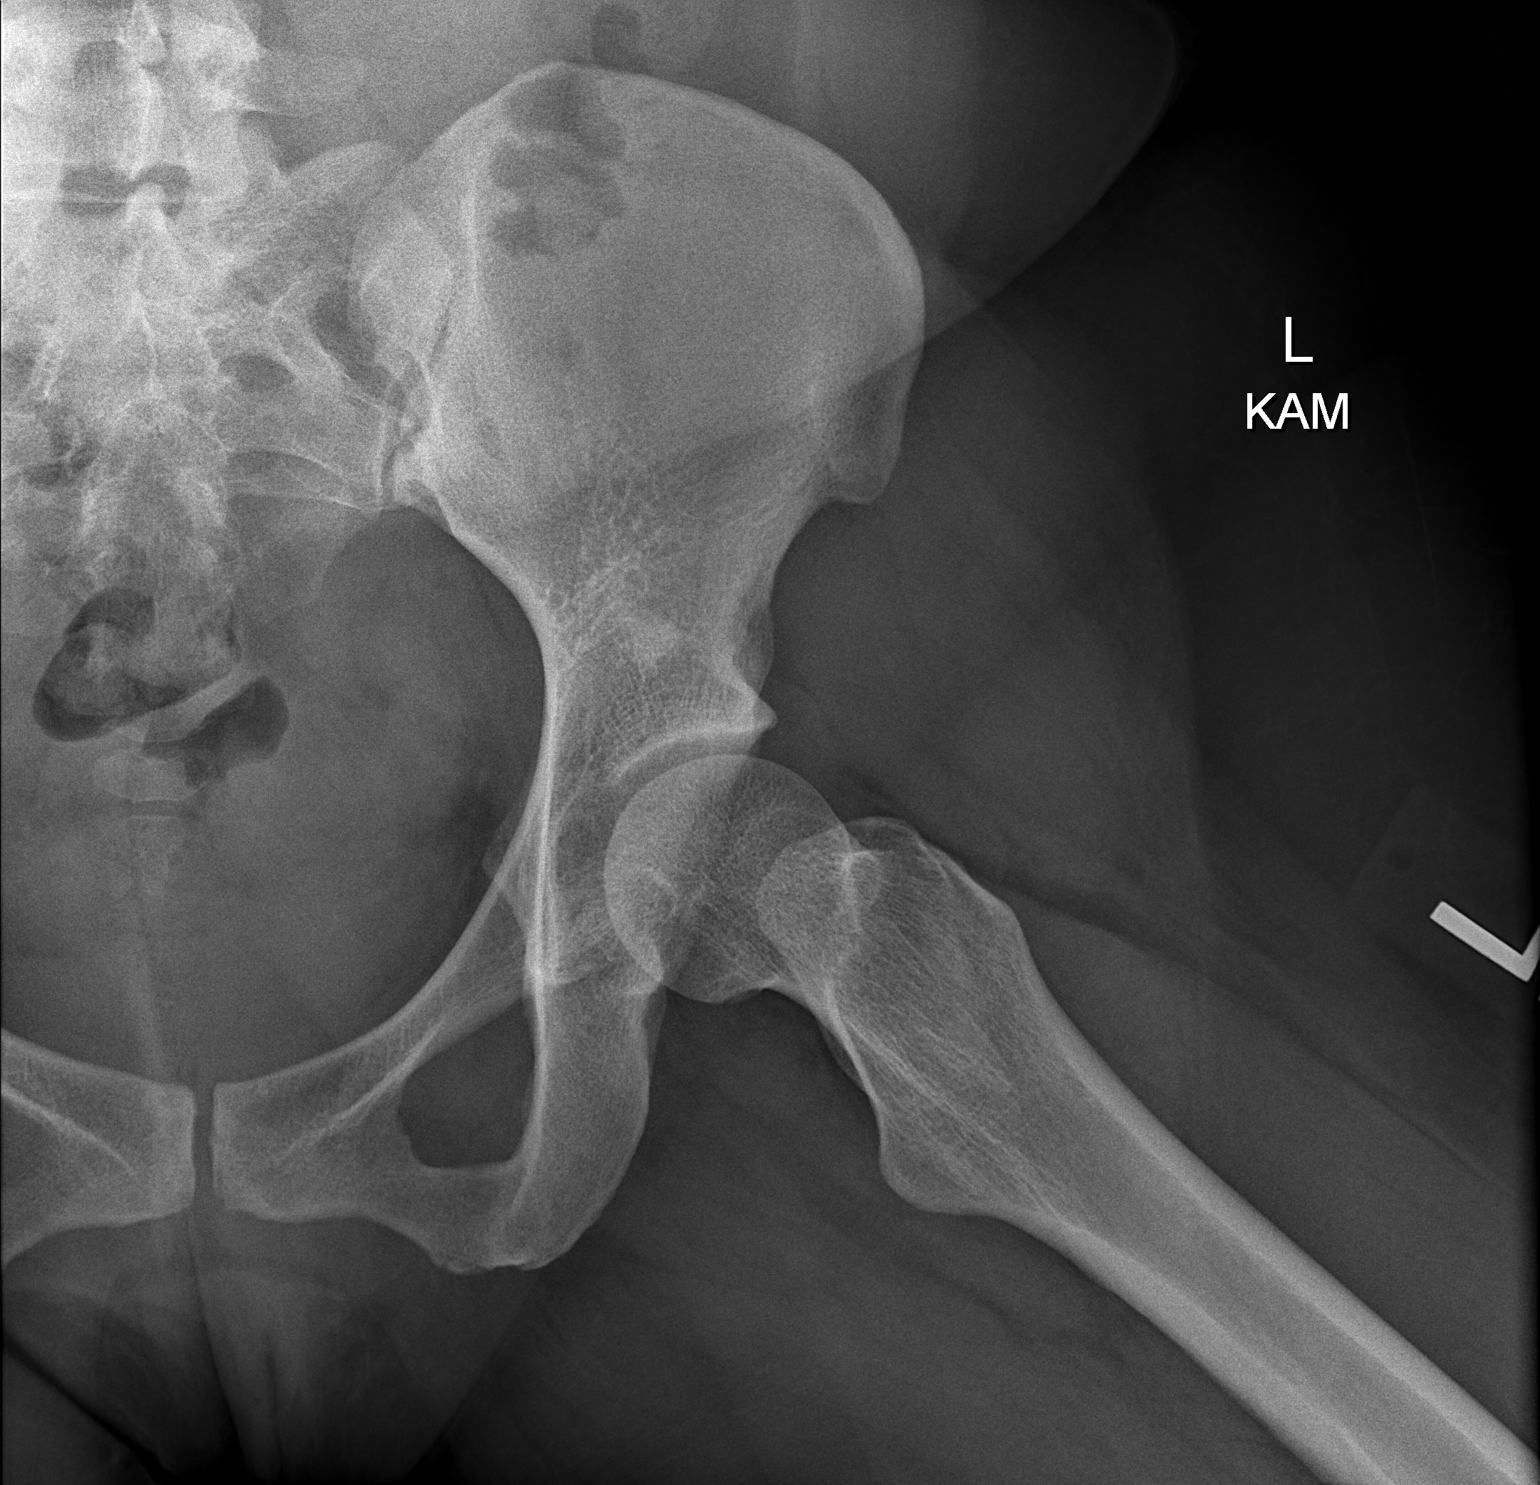

[2 of 2 positions shown; findings below may reference images not displayed]

FINDINGS: There is no evidence of hip fracture or dislocation. There is no
evidence of arthropathy or other focal bone abnormality.
IMPRESSION: Negative.

## 2021-09-04 DIAGNOSIS — Z113 Encounter for screening for infections with a predominantly sexual mode of transmission: Secondary | ICD-10-CM | POA: Diagnosis not present

## 2021-09-04 DIAGNOSIS — N930 Postcoital and contact bleeding: Secondary | ICD-10-CM | POA: Diagnosis not present

## 2021-09-04 DIAGNOSIS — Z01419 Encounter for gynecological examination (general) (routine) without abnormal findings: Secondary | ICD-10-CM | POA: Diagnosis not present

## 2021-09-04 DIAGNOSIS — N926 Irregular menstruation, unspecified: Secondary | ICD-10-CM | POA: Diagnosis not present

## 2021-09-27 DIAGNOSIS — N930 Postcoital and contact bleeding: Secondary | ICD-10-CM | POA: Diagnosis not present

## 2021-10-23 DIAGNOSIS — D219 Benign neoplasm of connective and other soft tissue, unspecified: Secondary | ICD-10-CM | POA: Diagnosis not present

## 2021-10-23 DIAGNOSIS — R232 Flushing: Secondary | ICD-10-CM | POA: Diagnosis not present

## 2021-10-23 DIAGNOSIS — N920 Excessive and frequent menstruation with regular cycle: Secondary | ICD-10-CM | POA: Diagnosis not present

## 2021-11-05 DIAGNOSIS — R232 Flushing: Secondary | ICD-10-CM | POA: Diagnosis not present

## 2022-05-03 ENCOUNTER — Telehealth: Payer: Self-pay

## 2022-05-03 NOTE — Patient Outreach (Signed)
  Care Coordination   05/03/2022 Name: Anna Schwartz MRN: [id number] DOB: 06-27-1981   Care Coordination Outreach Attempts:  An unsuccessful telephone outreach was attempted today to offer the patient information about available care coordination services as a benefit of their health plan.   Follow Up Plan:  Additional outreach attempts will be made to offer the patient care coordination information and services.   Encounter Outcome:  No Answer  Care Coordination Interventions Activated:  No   Care Coordination Interventions:  No, not indicated    [facility] Care Management [phone]

## 2022-05-30 ENCOUNTER — Telehealth: Payer: Self-pay

## 2022-05-30 NOTE — Patient Outreach (Signed)
  Care Coordination   05/30/2022 Name: Arlicia Paquette Ralston MRN: [id number] DOB: 13-Jan-1981   Care Coordination Outreach Attempts:  A second unsuccessful outreach was attempted today to offer the patient with information about available care coordination services as a benefit of their health plan.     Follow Up Plan:  Additional outreach attempts will be made to offer the patient care coordination information and services.   Encounter Outcome:  No Answer  Care Coordination Interventions Activated:  No   Care Coordination Interventions:  No, not indicated    [facility] [phone]

## 2022-06-12 ENCOUNTER — Telehealth: Payer: Self-pay

## 2022-06-12 NOTE — Patient Outreach (Signed)
  Care Coordination   06/12/2022 Name: Anna Schwartz MRN: [id number] DOB: 03-30-1981   Care Coordination Outreach Attempts:  A third unsuccessful outreach was attempted today to offer the patient with information about available care coordination services as a benefit of their health plan.   Follow Up Plan:  No further outreach attempts will be made at this time. We have been unable to contact the patient to offer or enroll patient in care coordination services  Encounter Outcome:  No Answer  Care Coordination Interventions Activated:  No   Care Coordination Interventions:  No, not indicated    [facility] [phone]

## 2022-12-30 DIAGNOSIS — F4320 Adjustment disorder, unspecified: Secondary | ICD-10-CM | POA: Diagnosis not present

## 2023-05-08 DIAGNOSIS — R92323 Mammographic fibroglandular density, bilateral breasts: Secondary | ICD-10-CM | POA: Diagnosis not present

## 2023-05-08 DIAGNOSIS — Z1231 Encounter for screening mammogram for malignant neoplasm of breast: Secondary | ICD-10-CM | POA: Diagnosis not present

## 2023-11-24 DIAGNOSIS — Z79899 Other long term (current) drug therapy: Secondary | ICD-10-CM | POA: Diagnosis not present

## 2023-11-24 DIAGNOSIS — F419 Anxiety disorder, unspecified: Secondary | ICD-10-CM | POA: Diagnosis not present

## 2023-11-28 DIAGNOSIS — F419 Anxiety disorder, unspecified: Secondary | ICD-10-CM | POA: Diagnosis not present

## 2024-05-18 DIAGNOSIS — Z1231 Encounter for screening mammogram for malignant neoplasm of breast: Secondary | ICD-10-CM | POA: Diagnosis not present

## 2024-05-18 DIAGNOSIS — R92333 Mammographic heterogeneous density, bilateral breasts: Secondary | ICD-10-CM | POA: Diagnosis not present

## 2024-06-07 ENCOUNTER — Inpatient Hospital Stay: Attending: Licensed Clinical Social Worker | Admitting: Licensed Clinical Social Worker

## 2024-06-07 NOTE — Progress Notes (Signed)
 [facility] Advance Directives Clinical Social Work  Patient presented to [facility]  to review and complete healthcare advance directives.  Clinical Social Worker met with patient and pt's spouse.  The patient designated Anna Schwartz as their primary healthcare agent and Anna Schwartz (spouse) as their secondary agent.  Patient also completed healthcare living will.    Documents were notarized and copies made for patient/family. Clinical Social Worker will send documents to medical records to be scanned into patient's chart. Clinical Social Worker encouraged patient/family to contact with any additional questions or concerns.   Anna Fredman E Rehaan Viloria, LCSW Clinical Social Worker [facility]
